# Patient Record
Sex: Female | Born: 1960 | Race: White | Hispanic: No | Marital: Married | Smoking: Never smoker
Health system: Southern US, Community
[De-identification: ages and names within clinical notes are randomized; demographics above are authoritative.]

## PROBLEM LIST (undated history)

## (undated) DIAGNOSIS — N390 Urinary tract infection, site not specified: Secondary | ICD-10-CM

## (undated) DIAGNOSIS — R002 Palpitations: Secondary | ICD-10-CM

## (undated) DIAGNOSIS — R32 Unspecified urinary incontinence: Secondary | ICD-10-CM

## (undated) DIAGNOSIS — R011 Cardiac murmur, unspecified: Secondary | ICD-10-CM

## (undated) DIAGNOSIS — T7840XA Allergy, unspecified, initial encounter: Secondary | ICD-10-CM

## (undated) DIAGNOSIS — C801 Malignant (primary) neoplasm, unspecified: Secondary | ICD-10-CM

## (undated) DIAGNOSIS — K219 Gastro-esophageal reflux disease without esophagitis: Secondary | ICD-10-CM

## (undated) HISTORY — DX: Cardiac murmur, unspecified: R01.1

## (undated) HISTORY — DX: Gastro-esophageal reflux disease without esophagitis: K21.9

## (undated) HISTORY — DX: Urinary tract infection, site not specified: N39.0

## (undated) HISTORY — DX: Malignant (primary) neoplasm, unspecified: C80.1

## (undated) HISTORY — DX: Allergy, unspecified, initial encounter: T78.40XA

## (undated) HISTORY — PX: OTHER SURGICAL HISTORY: SHX169

## (undated) HISTORY — DX: Palpitations: R00.2

## (undated) HISTORY — DX: Unspecified urinary incontinence: R32

## (undated) HISTORY — PX: COLONOSCOPY: SHX174

---

## 1998-07-07 ENCOUNTER — Other Ambulatory Visit: Admission: RE | Admit: 1998-07-07 | Discharge: 1998-07-07 | Payer: Self-pay | Admitting: Internal Medicine

## 1999-11-11 ENCOUNTER — Other Ambulatory Visit: Admission: RE | Admit: 1999-11-11 | Discharge: 1999-11-11 | Payer: Self-pay | Admitting: Obstetrics and Gynecology

## 2000-03-10 ENCOUNTER — Encounter: Admission: RE | Admit: 2000-03-10 | Discharge: 2000-03-10 | Payer: Self-pay

## 2000-03-20 ENCOUNTER — Encounter: Admission: RE | Admit: 2000-03-20 | Discharge: 2000-03-20 | Payer: Self-pay

## 2001-01-12 ENCOUNTER — Other Ambulatory Visit: Admission: RE | Admit: 2001-01-12 | Discharge: 2001-01-12 | Payer: Self-pay | Admitting: Obstetrics and Gynecology

## 2001-08-28 ENCOUNTER — Encounter: Admission: RE | Admit: 2001-08-28 | Discharge: 2001-08-28 | Payer: Self-pay

## 2002-02-05 ENCOUNTER — Other Ambulatory Visit: Admission: RE | Admit: 2002-02-05 | Discharge: 2002-02-05 | Payer: Self-pay | Admitting: Obstetrics and Gynecology

## 2003-03-28 ENCOUNTER — Other Ambulatory Visit: Admission: RE | Admit: 2003-03-28 | Discharge: 2003-03-28 | Payer: Self-pay | Admitting: Obstetrics and Gynecology

## 2004-01-25 HISTORY — PX: TUBAL LIGATION: SHX77

## 2004-05-26 ENCOUNTER — Other Ambulatory Visit: Admission: RE | Admit: 2004-05-26 | Discharge: 2004-05-26 | Payer: Self-pay | Admitting: Obstetrics and Gynecology

## 2005-12-16 ENCOUNTER — Encounter: Admission: RE | Admit: 2005-12-16 | Discharge: 2005-12-16 | Payer: Self-pay | Admitting: Family Medicine

## 2006-01-19 ENCOUNTER — Ambulatory Visit (HOSPITAL_COMMUNITY): Admission: RE | Admit: 2006-01-19 | Discharge: 2006-01-19 | Payer: Self-pay | Admitting: Obstetrics and Gynecology

## 2011-11-25 LAB — HM MAMMOGRAPHY

## 2012-02-25 LAB — HM PAP SMEAR

## 2012-10-05 ENCOUNTER — Ambulatory Visit (INDEPENDENT_AMBULATORY_CARE_PROVIDER_SITE_OTHER): Payer: BC Managed Care – PPO | Admitting: Adult Health

## 2012-10-05 ENCOUNTER — Encounter: Payer: Self-pay | Admitting: Adult Health

## 2012-10-05 ENCOUNTER — Telehealth: Payer: Self-pay | Admitting: *Deleted

## 2012-10-05 ENCOUNTER — Other Ambulatory Visit: Payer: Self-pay | Admitting: Adult Health

## 2012-10-05 VITALS — BP 102/72 | HR 76 | Temp 98.7°F | Resp 12 | Ht 63.0 in | Wt 166.0 lb

## 2012-10-05 DIAGNOSIS — B373 Candidiasis of vulva and vagina: Secondary | ICD-10-CM

## 2012-10-05 DIAGNOSIS — B3731 Acute candidiasis of vulva and vagina: Secondary | ICD-10-CM | POA: Insufficient documentation

## 2012-10-05 DIAGNOSIS — R222 Localized swelling, mass and lump, trunk: Secondary | ICD-10-CM | POA: Insufficient documentation

## 2012-10-05 DIAGNOSIS — R7989 Other specified abnormal findings of blood chemistry: Secondary | ICD-10-CM

## 2012-10-05 DIAGNOSIS — R229 Localized swelling, mass and lump, unspecified: Secondary | ICD-10-CM

## 2012-10-05 DIAGNOSIS — Z1211 Encounter for screening for malignant neoplasm of colon: Secondary | ICD-10-CM | POA: Insufficient documentation

## 2012-10-05 DIAGNOSIS — I499 Cardiac arrhythmia, unspecified: Secondary | ICD-10-CM | POA: Insufficient documentation

## 2012-10-05 DIAGNOSIS — Z0000 Encounter for general adult medical examination without abnormal findings: Secondary | ICD-10-CM | POA: Insufficient documentation

## 2012-10-05 LAB — CBC WITH DIFFERENTIAL/PLATELET
Basophils Absolute: 0 10*3/uL (ref 0.0–0.1)
Eosinophils Absolute: 0.1 10*3/uL (ref 0.0–0.7)
Eosinophils Relative: 1.2 % (ref 0.0–5.0)
HCT: 36.7 % (ref 36.0–46.0)
Lymphs Abs: 1.7 10*3/uL (ref 0.7–4.0)
MCHC: 34.4 g/dL (ref 30.0–36.0)
MCV: 92.2 fl (ref 78.0–100.0)
Monocytes Absolute: 0.4 10*3/uL (ref 0.1–1.0)
Neutrophils Relative %: 63.3 % (ref 43.0–77.0)
Platelets: 240 10*3/uL (ref 150.0–400.0)
RDW: 12 % (ref 11.5–14.6)
WBC: 6 10*3/uL (ref 4.5–10.5)

## 2012-10-05 LAB — FERRITIN: Ferritin: 315.6 ng/mL — ABNORMAL HIGH (ref 10.0–291.0)

## 2012-10-05 LAB — HEPATIC FUNCTION PANEL
ALT: 18 U/L (ref 0–35)
AST: 15 U/L (ref 0–37)
Total Bilirubin: 1 mg/dL (ref 0.3–1.2)
Total Protein: 7 g/dL (ref 6.0–8.3)

## 2012-10-05 LAB — TSH: TSH: 1.45 u[IU]/mL (ref 0.35–5.50)

## 2012-10-05 LAB — BASIC METABOLIC PANEL
BUN: 14 mg/dL (ref 6–23)
CO2: 27 mEq/L (ref 19–32)
Chloride: 107 mEq/L (ref 96–112)
Creatinine, Ser: 0.6 mg/dL (ref 0.4–1.2)
Glucose, Bld: 83 mg/dL (ref 70–99)
Potassium: 3.9 mEq/L (ref 3.5–5.1)

## 2012-10-05 LAB — LIPID PANEL
Cholesterol: 193 mg/dL (ref 0–200)
LDL Cholesterol: 121 mg/dL — ABNORMAL HIGH (ref 0–99)
Triglycerides: 95 mg/dL (ref 0.0–149.0)

## 2012-10-05 MED ORDER — FLUCONAZOLE 150 MG PO TABS: ORAL_TABLET | ORAL | Status: DC

## 2012-10-05 NOTE — Assessment & Plan Note (Signed)
GI referral for screening colonoscopy. She would like referral to Dr. Chales Abrahams in [place].

## 2012-10-05 NOTE — Progress Notes (Signed)
Subjective:    Patient ID: Mallory Sanchez, female    DOB: [birth year], 52 y.o.   MRN: [id number]  HPI  He should is a pleasant 52 year old female who presents to clinic to establish care. She has not had any regular PCP in years. She has been followed by her gynecologist, Dr. Miguel Aschoff. She has a few concerns to discuss:  1) She believes she has a yeast infection. She has burning and itching around the vaginal area.  2) She has low back problems since April. She was told that she had a bulging disk and that it would heal on its own. Periodically she has exacerbations and recently had one ~ 1 month ago. She is considerably improved.   3) She has an area on the left side of her back near the bra strap that feels like a bump. She would like this evaluated. She denies any discomfort. Reports that her husband saw it and questioned her about getting it checked.  4) In the remote past she has had some symptoms which she attributes to her stomach - discomfort, indigestion, etc. However, she wants to make sure this is not her heart. She reports sometimes feeling like her heart is "doing flips". No shortness of breath or chest pain associated with symptoms. No syncope or near syncope.    Review of Systems  Constitutional: Negative.   HENT: Negative.   Eyes: Negative.   Respiratory: Negative.   Cardiovascular: Negative for chest pain and leg swelling.       Occasional sensation that her heart is "doing flips"  Gastrointestinal: Negative for nausea, vomiting, abdominal pain, diarrhea and blood in stool.       Epigastric discomfort. Does not occur regularly  Endocrine: Negative.   Genitourinary: Negative.        Followed by Dr. Tenny Craw, GYN  Musculoskeletal: Negative.   Skin:       "bump" on left side of back near bra strap  Allergic/Immunologic: Negative.   Neurological: Negative.   Hematological: Negative.        Family hx of hemochromatosis  Psychiatric/Behavioral: Negative.         Objective:   Physical Exam  Constitutional: She is oriented to person, place, and time. No distress.  Overweight, pleasant 52 y/o female in NAD  HENT:  Head: Normocephalic and atraumatic.  Right Ear: External ear normal.  Left Ear: External ear normal.  Nose: Nose normal.  Mouth/Throat: Oropharynx is clear and moist. No oropharyngeal exudate.  Eyes: Conjunctivae and EOM are normal. Pupils are equal, round, and reactive to light.  Neck: Normal range of motion. Neck supple. No tracheal deviation present. No thyromegaly present.  Cardiovascular: Normal rate, regular rhythm and normal heart sounds.  Exam reveals no gallop and no friction rub.   No murmur heard. Pulmonary/Chest: Effort normal and breath sounds normal. No respiratory distress. She has no wheezes. She has no rales. She exhibits no tenderness.  Abdominal: Soft. Bowel sounds are normal. She exhibits no distension and no mass. There is no tenderness. There is no rebound and no guarding.  Genitourinary:  Deferred. Followed by GYN and has appt next month  Musculoskeletal: Normal range of motion. She exhibits no edema and no tenderness.  Lymphadenopathy:    She has no cervical adenopathy.  Neurological: She is alert and oriented to person, place, and time. She has normal reflexes. No cranial nerve deficit. Coordination normal.  Skin: Skin is warm and dry. No rash noted. No erythema.  Round, movable,  smooth lump under the skin surface on the left side of her back at area of bra strap. Non painful.  Psychiatric: She has a normal mood and affect. Her behavior is normal. Thought content normal.    BP 102/72  Pulse 76  Temp(Src) 98.7 F (37.1 C) (Oral)  Resp 12  Ht 5\' 3"  (1.6 m)  Wt 166 lb (75.297 kg)  BMI 29.41 kg/m2  SpO2 97%       Assessment & Plan:

## 2012-10-05 NOTE — Patient Instructions (Addendum)
   Thank you for choosing [place] at [facility] for your health care needs.  Please have your labs drawn prior to leaving the office.  The results will be available through MyChart for your convenience. Please remember to activate this. The activation code is located at the end of this form.  I am referring you to GI for screening colonoscopy.  I am also sending you to your Dermatologist to evaluate the area on your back.  EKG was normal.

## 2012-10-05 NOTE — Assessment & Plan Note (Addendum)
No current symptoms. Obtain baseline EKG - Normal sinus rhythm. Instructed to RTC if symptoms return or persist.

## 2012-10-05 NOTE — Assessment & Plan Note (Signed)
Normal physical exam excluding breast, pelvic. Patient is followed by GYN. Check labs: CBC with differential, TSH, basic metabolic panel, hepatic panel, ferritin, lipids. Refer for screening colonoscopy. Mammogram in October.

## 2012-10-05 NOTE — Assessment & Plan Note (Signed)
Epidermoid cysts? She would like this evaluated. Refer to Dermatology.

## 2012-10-05 NOTE — Telephone Encounter (Signed)
Called and notified pt of normal EKG.

## 2012-10-05 NOTE — Assessment & Plan Note (Signed)
Diflucan 150 mg x1. May repeat in 72 hours if symptoms not resolved.

## 2012-10-06 LAB — IRON AND TIBC
%SAT: 43 % (ref 20–55)
Iron: 125 ug/dL (ref 42–145)

## 2012-10-11 ENCOUNTER — Ambulatory Visit: Payer: Self-pay | Admitting: Hematology and Oncology

## 2012-10-11 LAB — IRON AND TIBC
Iron Bind.Cap.(Total): 323 ug/dL (ref 250–450)
Iron Saturation: 20 %
Iron: 65 ug/dL (ref 50–170)
Unbound Iron-Bind.Cap.: 258 ug/dL

## 2012-10-24 ENCOUNTER — Ambulatory Visit: Payer: Self-pay | Admitting: Hematology and Oncology

## 2012-11-01 ENCOUNTER — Ambulatory Visit: Payer: Self-pay | Admitting: Adult Health

## 2012-11-24 ENCOUNTER — Ambulatory Visit: Payer: Self-pay | Admitting: Hematology and Oncology

## 2013-05-07 ENCOUNTER — Ambulatory Visit: Payer: Self-pay | Admitting: Hematology and Oncology

## 2013-05-07 LAB — CBC CANCER CENTER
Basophil #: 0 x10 3/mm (ref 0.0–0.1)
Basophil %: 0.6 %
EOS PCT: 1.9 %
Eosinophil #: 0.1 x10 3/mm (ref 0.0–0.7)
HCT: 38.2 % (ref 35.0–47.0)
HGB: 12.7 g/dL (ref 12.0–16.0)
LYMPHS PCT: 30.7 %
Lymphocyte #: 2.1 x10 3/mm (ref 1.0–3.6)
MCH: 31 pg (ref 26.0–34.0)
MCHC: 33.4 g/dL (ref 32.0–36.0)
MCV: 93 fL (ref 80–100)
MONO ABS: 0.6 x10 3/mm (ref 0.2–0.9)
MONOS PCT: 8.6 %
Neutrophil #: 3.9 x10 3/mm (ref 1.4–6.5)
Neutrophil %: 58.2 %
Platelet: 237 x10 3/mm (ref 150–440)
RBC: 4.12 10*6/uL (ref 3.80–5.20)
RDW: 12.2 % (ref 11.5–14.5)
WBC: 6.7 x10 3/mm (ref 3.6–11.0)

## 2013-05-07 LAB — IRON AND TIBC
IRON BIND. CAP.(TOTAL): 285 ug/dL (ref 250–450)
Iron Saturation: 28 %
Iron: 81 ug/dL (ref 50–170)
Unbound Iron-Bind.Cap.: 204 ug/dL

## 2013-05-07 LAB — FERRITIN: Ferritin (ARMC): 319 ng/mL (ref 8–388)

## 2013-05-24 ENCOUNTER — Ambulatory Visit: Payer: Self-pay | Admitting: Hematology and Oncology

## 2013-07-11 ENCOUNTER — Ambulatory Visit (INDEPENDENT_AMBULATORY_CARE_PROVIDER_SITE_OTHER): Payer: BC Managed Care – PPO | Admitting: Adult Health

## 2013-07-11 ENCOUNTER — Encounter: Payer: Self-pay | Admitting: Adult Health

## 2013-07-11 VITALS — BP 100/64 | HR 82 | Temp 98.1°F | Resp 16 | Ht 63.0 in | Wt 162.2 lb

## 2013-07-11 DIAGNOSIS — R7989 Other specified abnormal findings of blood chemistry: Secondary | ICD-10-CM | POA: Insufficient documentation

## 2013-07-11 DIAGNOSIS — H938X9 Other specified disorders of ear, unspecified ear: Secondary | ICD-10-CM

## 2013-07-11 DIAGNOSIS — R300 Dysuria: Secondary | ICD-10-CM

## 2013-07-11 LAB — POCT URINALYSIS DIPSTICK
Bilirubin, UA: NEGATIVE
GLUCOSE UA: NEGATIVE
Ketones, UA: NEGATIVE
NITRITE UA: NEGATIVE
Protein, UA: NEGATIVE
SPEC GRAV UA: 1.025
Urobilinogen, UA: 0.2
pH, UA: 6

## 2013-07-11 MED ORDER — CIPROFLOXACIN HCL 250 MG PO TABS: 250.0000 mg | ORAL_TABLET | Freq: Two times a day (BID) | ORAL | Status: DC

## 2013-07-11 NOTE — Progress Notes (Signed)
Pre visit review using our clinic review tool, if applicable. No additional management support is needed unless otherwise documented below in the visit note. 

## 2013-07-11 NOTE — Progress Notes (Signed)
Patient ID: Mallory Sanchez, female   DOB: August 24, 1960, 53 y.o.   MRN: [id number]   Subjective:    Patient ID: Mallory Sanchez, female    DOB: [birth year], 53 y.o.   MRN: [id number]  HPI Pt is a pleasant 53 y/o female who presents to clinic with several concerns:  1) Dysuria - 2 week symptoms. No fever, chills or hematuria. Hx of frequent UTIs in the past and has had her urethra dilated x 2 ~ 10 years ago by urology. Symptoms improved afterward.  2) Right ear pain. "Feels stopped up". Has been to urgent care twice. They noticed she had otitis media of the left ear as well. She has been on Augmentin and then Omnicef and prednisone. Has not been swimming. Began in early May. Initially believed to have started with allergies. Right ear - sounds are muffled. No pain at present. Has not been evaluated by ENT.  3) Elevated Ferritin. She had a mildly elevated ferritin level and fam hx of hemachromatosis. I had referred her to the [facility] for evaluation and recommendations. Pt would like to review her labs from the cancer center today.  Past Medical History  Diagnosis Date  . GERD (gastroesophageal reflux disease)   . UTI (lower urinary tract infection)   . Urine incontinence     Current Outpatient Prescriptions on File Prior to Visit  Medication Sig Dispense Refill  . meloxicam (MOBIC) 15 MG tablet Take 15 mg by mouth daily as needed.        No current facility-administered medications on file prior to visit.     Review of Systems  Constitutional: Negative.  Negative for fever, chills and fatigue.  HENT: Negative.  Negative for ear pain (right ear feels stopped up), postnasal drip and sinus pressure.   Eyes: Negative.   Respiratory: Negative.  Negative for cough and shortness of breath.   Cardiovascular: Negative.   Gastrointestinal: Negative.   Endocrine: Negative.   Genitourinary: Negative.   Musculoskeletal: Negative.   Skin: Negative.   Allergic/Immunologic: Negative.     Neurological: Negative.   Hematological: Negative.   Psychiatric/Behavioral: Negative.        Objective:  BP 100/64  Pulse 82  Temp(Src) 98.1 F (36.7 C) (Oral)  Resp 16  Ht 5\' 3"  (1.6 m)  Wt 162 lb 4 oz (73.596 kg)  BMI 28.75 kg/m2  SpO2 97%   Physical Exam  Constitutional: She is oriented to person, place, and time. No distress.  HENT:  Head: Normocephalic and atraumatic.  Right Ear: External ear normal.  Left Ear: External ear normal.  Cardiovascular: Normal rate and regular rhythm.   Pulmonary/Chest: Effort normal. No respiratory distress.  Musculoskeletal: Normal range of motion.  Neurological: She is alert and oriented to person, place, and time.  Skin: Skin is warm and dry.  Psychiatric: She has a normal mood and affect. Her behavior is normal. Judgment and thought content normal.      Assessment & Plan:   1. Dysuria UA shows some leukocytes but not nitrites or blood. Will treat empirically with Cipro and send urine for culture. - POCT Urinalysis Dipstick - Urine Culture  2. Sensation of pressure in ear She is s/p 2 rounds of antibiotics and prednisone when seen at [facility]. No s/s of otitis. Will try decongestant x 1 week. Suggested children's dimetapp which is very mild. She may also try sudafed if prefers. Blood pressure is low normal. If no improvement in 1 week will refer to ENT.  3. Elevated ferritin level I have not seen notes from the [facility] or labs. The [facility] does not open until 8:30 am. Pt was sent for evaluation of slightly elevated iron given her family hx of hemochromatosis. I will obtain records from the cancer center and labs that were Sanchez and review with pt. Advised that we would periodically check her levels to keep a close watch. Pt agreed with plan.

## 2013-07-11 NOTE — Patient Instructions (Addendum)
  Start Cipro 250 mg  Twice a day for 3 days.  I am sending the urine for culture. We will contact you with results once they're available.  Try children's Dimetapp elixir - decongestant for one week. If you prefer you can try Sudafed. Please call if symptoms are not improved within one week and I will refer you to ENT.  I will get records from the [facility] regarding your visit for elevated ferritin and I will call you once I have reviewed.

## 2013-07-11 NOTE — Progress Notes (Signed)
   Subjective:    Patient ID: Mallory Sanchez, female    DOB: [birth year], 53 y.o.   MRN: [id number]  HPI  Past Medical History  Diagnosis Date  . GERD (gastroesophageal reflux disease)   . UTI (lower urinary tract infection)   . Urine incontinence     Current Outpatient Prescriptions on File Prior to Visit  Medication Sig Dispense Refill  . meloxicam (MOBIC) 15 MG tablet Take 15 mg by mouth daily as needed.        No current facility-administered medications on file prior to visit.     Review of Systems     Objective:  BP 100/64  Pulse 82  Temp(Src) 98.1 F (36.7 C) (Oral)  Resp 16  Ht 5\' 3"  (1.6 m)  Wt 162 lb 4 oz (73.596 kg)  BMI 28.75 kg/m2  SpO2 97%   Physical Exam        Assessment & Plan:

## 2013-07-13 LAB — URINE CULTURE: Colony Count: 40000

## 2013-07-18 ENCOUNTER — Telehealth: Payer: Self-pay

## 2013-07-18 NOTE — Telephone Encounter (Signed)
Spoke with patient and informed her that her urine culture was inconclusive. Patient states she is feeling better but her urine is still dark in color. Patient stated that her Right ear is still bothering her. Would like a referral to see ENT Dr. Constance Holster [phone]. She also asked about her lab work from the [facility] center. I let patient know we have not received notes from the cancer center yet and I will request them again. Please advise on the urine and ENT referral.

## 2013-07-19 ENCOUNTER — Other Ambulatory Visit: Payer: Self-pay | Admitting: Adult Health

## 2013-07-19 DIAGNOSIS — H938X1 Other specified disorders of right ear: Secondary | ICD-10-CM

## 2013-07-19 NOTE — Telephone Encounter (Signed)
Referred to ENt. The dark color of urine may be that she is not drinking enough water. Increase water to 1-2 liters daily if not contraindicated. If no improvement then we can check a urinalysis.

## 2013-07-19 NOTE — Telephone Encounter (Signed)
Left message, notifying pt.  

## 2013-07-30 ENCOUNTER — Telehealth: Payer: Self-pay | Admitting: Adult Health

## 2013-07-30 NOTE — Telephone Encounter (Signed)
Labs received and given to Raquel for review.

## 2013-07-30 NOTE — Telephone Encounter (Signed)
Patient called in states that she is still waiting on results for the labs she had at the cancer center. I advised patient that we would re-fax for the lab results along with office visit with Dr. Chong Sicilian which I faxed at 11:17. Please contact patient when we get these results. She states she has been waiting a couple of weeks now.

## 2014-04-24 ENCOUNTER — Other Ambulatory Visit: Payer: Self-pay | Admitting: Obstetrics and Gynecology

## 2014-04-24 DIAGNOSIS — R928 Other abnormal and inconclusive findings on diagnostic imaging of breast: Secondary | ICD-10-CM

## 2014-04-25 ENCOUNTER — Other Ambulatory Visit: Payer: Self-pay

## 2014-04-25 ENCOUNTER — Ambulatory Visit
Admission: RE | Admit: 2014-04-25 | Discharge: 2014-04-25 | Disposition: A | Discharge status: Home / Self Care | Payer: BLUE CROSS/BLUE SHIELD | Source: Ambulatory Visit | Attending: Obstetrics and Gynecology | Admitting: Obstetrics and Gynecology

## 2014-04-25 DIAGNOSIS — R928 Other abnormal and inconclusive findings on diagnostic imaging of breast: Secondary | ICD-10-CM

## 2015-02-23 ENCOUNTER — Other Ambulatory Visit: Payer: Self-pay

## 2015-02-23 DIAGNOSIS — Z1231 Encounter for screening mammogram for malignant neoplasm of breast: Secondary | ICD-10-CM

## 2015-03-20 NOTE — Progress Notes (Signed)
     HPI: 55 year old female for evaluation of palpitations. Patient states she had a murmur as a child but no other past cardiac history. Over the past 3 years she has had intermittent palpitations. These are described as a skip and flutter. She has dyspnea with more extreme activities but not routine activities. No orthopnea, PND, pedal edema, chest pain or syncope.  Current Outpatient Prescriptions  Medication Sig Dispense Refill  . meloxicam (MOBIC) 15 MG tablet Take 15 mg by mouth daily as needed.      No current facility-administered medications for this visit.    Allergies  Allergen Reactions  . Bactrim [Sulfamethoxazole-Trimethoprim] Nausea Only  . Compazine [Prochlorperazine Edisylate] Other (See Comments)    Cognitive change  . Erythromycin Nausea Only  . Levaquin [Levofloxacin] Other (See Comments)    Caused myalgia.     Past Medical History  Diagnosis Date  . GERD (gastroesophageal reflux disease)   . UTI (lower urinary tract infection)   . Urine incontinence   . Murmur     Past Surgical History  Procedure Laterality Date  . Cesarean section  1981  . Tubal ligation  2006    Social History   Social History  . Marital Status: Married    Spouse Name: Mallory Sanchez  . Number of Children: 1  . Years of Education: 12   Occupational History  . Customer Service     [employer]   Social History Main Topics  . Smoking status: Never Smoker   . Smokeless tobacco: Never Used  . Alcohol Use: No  . Drug Use: No  . Sexual Activity: Yes    Birth Control/ Protection: Post-menopausal   Other Topics Concern  . Not on file   [place] is currently living in [place] with her husband, Mallory Sanchez, of 15 years. She has 1 daughter from a previous marriage. They have a dog. She works in Therapist, art for a Psychiatrist. She enjoys shopping and spending time with her grandchildren. She has 2 grandchildren (Mallory Sanchez age 91 and Mallory Sanchez age 98).  She also has a step grandchild, Mallory Sanchez, age 44.    Family History  Problem Relation Age of Onset  . Arthritis Mother   . Hyperlipidemia Mother   . Hypertension Mother   . Cancer Maternal Grandmother 36    Breast and unsure uterine/ovary?  . Cancer Paternal Aunt 8    breast  . CAD Mother     ROS: no fevers or chills, productive cough, hemoptysis, dysphasia, odynophagia, melena, hematochezia, dysuria, hematuria, rash, seizure activity, orthopnea, PND, pedal edema, claudication. Remaining systems are negative.  Physical Exam:   Blood pressure 108/72, pulse 77, height 5\' 2"  (1.575 m), weight 179 lb (81.194 kg).  General:  Well developed/obese in NAD Skin warm/dry Patient not depressed No peripheral clubbing Back-normal HEENT-normal/normal eyelids Neck supple/normal carotid upstroke bilaterally; no bruits; no JVD; no thyromegaly chest - CTA/ normal expansion CV - RRR/normal S1 and S2; no murmurs, rubs or gallops;  PMI nondisplaced Abdomen -NT/ND, no HSM, no mass, + bowel sounds, no bruit 2+ femoral pulses, no bruits Ext-no edema, chords, 2+ DP Neuro-grossly nonfocal  ECG Sinus rhythm at a rate of 77. No ST changes.

## 2015-03-23 ENCOUNTER — Encounter: Payer: Self-pay | Admitting: Cardiology

## 2015-03-23 ENCOUNTER — Ambulatory Visit (INDEPENDENT_AMBULATORY_CARE_PROVIDER_SITE_OTHER): Payer: BLUE CROSS/BLUE SHIELD | Admitting: Cardiology

## 2015-03-23 VITALS — BP 108/72 | HR 77 | Ht 62.0 in | Wt 179.0 lb

## 2015-03-23 DIAGNOSIS — R002 Palpitations: Secondary | ICD-10-CM | POA: Diagnosis not present

## 2015-03-23 NOTE — Patient Instructions (Signed)
Medication Instructions:   NO CHANGE  Labwork:  Your physician recommends that you HAVE LAB WORK TODAY  Testing/Procedures:  Your physician has requested that you have an echocardiogram. Echocardiography is a painless test that uses sound waves to create images of your heart. It provides your doctor with information about the size and shape of your heart and how well your heart's chambers and valves are working. This procedure takes approximately one hour. There are no restrictions for this procedure.   Your physician has recommended that you wear an event monitor. Event monitors are medical devices that record the heart's electrical activity. Doctors most often [place] these monitors to diagnose arrhythmias. Arrhythmias are problems with the speed or rhythm of the heartbeat. The monitor is a small, portable device. You can wear one while you do your normal daily activities. This is usually used to diagnose what is causing palpitations/syncope (passing out).  AT [place]:  Your physician recommends that you schedule a follow-up appointment in: [place]

## 2015-03-23 NOTE — Assessment & Plan Note (Addendum)
Etiology unclear. We will arrange an echocardiogram to assess LV function. Check TSH and potassium. Scheduled Cardio[facility] to further assess. Consider a beta blocker in the future if symptoms worsen.

## 2015-03-24 LAB — BASIC METABOLIC PANEL
BUN: 11 mg/dL (ref 7–25)
CALCIUM: 9.4 mg/dL (ref 8.6–10.4)
CO2: 26 mmol/L (ref 20–31)
Chloride: 105 mmol/L (ref 98–110)
Creat: 0.68 mg/dL (ref 0.50–1.05)
GLUCOSE: 87 mg/dL (ref 65–99)
Potassium: 4.3 mmol/L (ref 3.5–5.3)
Sodium: 141 mmol/L (ref 135–146)

## 2015-03-24 LAB — TSH: TSH: 1.53 mIU/L

## 2015-04-06 ENCOUNTER — Other Ambulatory Visit: Payer: Self-pay

## 2015-04-06 ENCOUNTER — Ambulatory Visit (INDEPENDENT_AMBULATORY_CARE_PROVIDER_SITE_OTHER): Payer: BLUE CROSS/BLUE SHIELD

## 2015-04-06 ENCOUNTER — Ambulatory Visit (HOSPITAL_COMMUNITY): Payer: BLUE CROSS/BLUE SHIELD | Attending: Cardiology

## 2015-04-06 DIAGNOSIS — R002 Palpitations: Secondary | ICD-10-CM | POA: Diagnosis not present

## 2015-04-20 ENCOUNTER — Ambulatory Visit: Payer: BLUE CROSS/BLUE SHIELD

## 2015-05-17 NOTE — Progress Notes (Signed)
      HPI: FU palpitations. Labs February 2017 showed normal potassium and TSH. Monitor March 2017 showed sinus with occasional PACs, brief PAT, rare PVC and isolated couplet. Echocardiogram March 2017 showed vigorous LV systolic function. Since last seen She has dyspnea with more extreme activities but not routine activities. No orthopnea, PND, pedal edema or syncope. No exertional chest pain. She continues to have brief flutters and "skip".  Current Outpatient Prescriptions  Medication Sig Dispense Refill  . meloxicam (MOBIC) 15 MG tablet Take 15 mg by mouth daily as needed.      No current facility-administered medications for this visit.     Past Medical History  Diagnosis Date  . GERD (gastroesophageal reflux disease)   . UTI (lower urinary tract infection)   . Urine incontinence   . Murmur     Past Surgical History  Procedure Laterality Date  . Cesarean section  1981  . Tubal ligation  2006    Social History   Social History  . Marital Status: Married    Spouse Name: [name]  . Number of Children: 1  . Years of Education: 12   Occupational History  . Customer Service     [employer]   Social History Main Topics  . Smoking status: Never Smoker   . Smokeless tobacco: Never Used  . Alcohol Use: No  . Drug Use: No  . Sexual Activity: Yes    Birth Control/ Protection: Post-menopausal   Other Topics Concern  . Not on file   [place] is currently living in [place] with her husband, [name], of 15 years. She has 1 daughter from a previous marriage. They have a dog. She works in Therapist, art for a Psychiatrist. She enjoys shopping and spending time with her grandchildren. She has 2 grandchildren ([name] age 53 and [name] age 19). She also has a step grandchild, [name], age 95.    Family History  Problem Relation Age of Onset  . Arthritis Mother   . Hyperlipidemia Mother   . Hypertension Mother   . Cancer Maternal  Grandmother 50    Breast and unsure uterine/ovary?  . Cancer Paternal Aunt 56    breast  . CAD Mother     ROS: no fevers or chills, productive cough, hemoptysis, dysphasia, odynophagia, melena, hematochezia, dysuria, hematuria, rash, seizure activity, orthopnea, PND, pedal edema, claudication. Remaining systems are negative.  Physical Exam: Well-developed well-nourished in no acute distress.  Skin is warm and dry.  HEENT is normal.  Neck is supple.  Chest is clear to auscultation with normal expansion.  Cardiovascular exam is regular rate and rhythm.  Abdominal exam nontender or distended. No masses palpated. Extremities show no edema. neuro grossly intact

## 2015-05-21 ENCOUNTER — Ambulatory Visit (INDEPENDENT_AMBULATORY_CARE_PROVIDER_SITE_OTHER): Payer: BLUE CROSS/BLUE SHIELD | Admitting: Cardiology

## 2015-05-21 ENCOUNTER — Encounter: Payer: Self-pay | Admitting: Cardiology

## 2015-05-21 VITALS — BP 124/72 | HR 80 | Ht 62.0 in | Wt 180.0 lb

## 2015-05-21 DIAGNOSIS — R002 Palpitations: Secondary | ICD-10-CM

## 2015-05-21 MED ORDER — METOPROLOL SUCCINATE ER 25 MG PO TB24: 25.0000 mg | ORAL_TABLET | Freq: Every day | ORAL | Status: DC

## 2015-05-21 NOTE — Assessment & Plan Note (Signed)
Patient continues to have palpitations.LV function is normal. Monitor shows sinus rhythm with PACs, brief PAT and PVCs. She is significantly symptomatic. We will add Toprol 25 mg daily. I will see her back in 6 months to see if this has improved.

## 2015-05-21 NOTE — Patient Instructions (Signed)
Medication Instructions:   START METOPROLOL SUCC ER 25 MG ONCE DAILY AT BEDTIME  Follow-Up:  Your physician wants you to follow-up in: [place] will receive a reminder letter in the mail two months in advance. If you don't receive a letter, please call our office to schedule the follow-up appointment.

## 2015-06-18 ENCOUNTER — Ambulatory Visit
Admission: RE | Admit: 2015-06-18 | Discharge: 2015-06-18 | Disposition: A | Discharge status: Home / Self Care | Payer: BLUE CROSS/BLUE SHIELD | Source: Ambulatory Visit

## 2015-06-18 DIAGNOSIS — N3001 Acute cystitis with hematuria: Secondary | ICD-10-CM | POA: Diagnosis not present

## 2015-06-18 DIAGNOSIS — R300 Dysuria: Secondary | ICD-10-CM | POA: Diagnosis not present

## 2015-06-18 DIAGNOSIS — Z1231 Encounter for screening mammogram for malignant neoplasm of breast: Secondary | ICD-10-CM

## 2015-06-19 DIAGNOSIS — Z01419 Encounter for gynecological examination (general) (routine) without abnormal findings: Secondary | ICD-10-CM | POA: Diagnosis not present

## 2015-06-19 DIAGNOSIS — Z6832 Body mass index (BMI) 32.0-32.9, adult: Secondary | ICD-10-CM | POA: Diagnosis not present

## 2015-06-19 DIAGNOSIS — Z124 Encounter for screening for malignant neoplasm of cervix: Secondary | ICD-10-CM | POA: Diagnosis not present

## 2015-06-19 DIAGNOSIS — Z130 Encounter for screening for diseases of the blood and blood-forming organs and certain disorders involving the immune mechanism: Secondary | ICD-10-CM | POA: Diagnosis not present

## 2015-06-19 DIAGNOSIS — Z1329 Encounter for screening for other suspected endocrine disorder: Secondary | ICD-10-CM | POA: Diagnosis not present

## 2015-06-19 DIAGNOSIS — Z13220 Encounter for screening for lipoid disorders: Secondary | ICD-10-CM | POA: Diagnosis not present

## 2015-11-16 ENCOUNTER — Telehealth: Payer: Self-pay | Admitting: Cardiology

## 2015-11-16 ENCOUNTER — Emergency Department (HOSPITAL_COMMUNITY): Payer: BLUE CROSS/BLUE SHIELD

## 2015-11-16 ENCOUNTER — Emergency Department (HOSPITAL_COMMUNITY)
Admission: EM | Admit: 2015-11-16 | Discharge: 2015-11-16 | Disposition: A | Discharge status: Home / Self Care | Payer: BLUE CROSS/BLUE SHIELD | Attending: Emergency Medicine | Admitting: Emergency Medicine

## 2015-11-16 ENCOUNTER — Encounter (HOSPITAL_COMMUNITY): Payer: Self-pay | Admitting: Emergency Medicine

## 2015-11-16 DIAGNOSIS — R079 Chest pain, unspecified: Secondary | ICD-10-CM | POA: Diagnosis not present

## 2015-11-16 DIAGNOSIS — J9811 Atelectasis: Secondary | ICD-10-CM | POA: Diagnosis not present

## 2015-11-16 DIAGNOSIS — R200 Anesthesia of skin: Secondary | ICD-10-CM | POA: Diagnosis present

## 2015-11-16 LAB — BASIC METABOLIC PANEL
Anion gap: 9 (ref 5–15)
BUN: 7 mg/dL (ref 6–20)
CALCIUM: 9.4 mg/dL (ref 8.9–10.3)
CO2: 25 mmol/L (ref 22–32)
CREATININE: 0.7 mg/dL (ref 0.44–1.00)
Chloride: 108 mmol/L (ref 101–111)
GFR calc non Af Amer: >60 mL/min (ref >60)
Glucose, Bld: 98 mg/dL (ref 65–99)
Potassium: 3.6 mmol/L (ref 3.5–5.1)
Sodium: 142 mmol/L (ref 135–145)

## 2015-11-16 LAB — CBC
HCT: 38.4 % (ref 36.0–46.0)
Hemoglobin: 13.1 g/dL (ref 12.0–15.0)
MCH: 31.5 pg (ref 26.0–34.0)
MCHC: 34.1 g/dL (ref 30.0–36.0)
MCV: 92.3 fL (ref 78.0–100.0)
PLATELETS: 274 10*3/uL (ref 150–400)
RBC: 4.16 MIL/uL (ref 3.87–5.11)
RDW: 12.2 % (ref 11.5–15.5)
WBC: 7.7 10*3/uL (ref 4.0–10.5)

## 2015-11-16 LAB — I-STAT TROPONIN, ED: TROPONIN I, POC: 0 ng/mL (ref 0.00–0.08)

## 2015-11-16 NOTE — Telephone Encounter (Signed)
New message  Pt call requesting to speak with RN. Pt states she has been experiencing traveling pain from her right hand through her arm, traveling through her chest down through her left hand. Pt states this has happen three times this morning 10/23. Pt would like to be seen today. Please call back to discuss

## 2015-11-16 NOTE — Telephone Encounter (Signed)
Returned call. Patient explains that she has had unusual symptom of traveling "tingling" (alternatively describes as a 'sort of numbness') going from right arm to left, traveling across chest. She denies other symptoms such as neck/jaw pain, chest or abdominal pain. Pt states at times, intermittent shortness of breath or chest pressure, though she thinks this is related to stress. (Recounts that her mother passed away in 2022-10-21). I advised patient to go to ER if her symptoms return, especially w any shortness of breath or chest pressure. She voiced understanding. States she will go to [facility] ED. Notes she will follow up at appt Friday unless in hospital/other conflict.

## 2015-11-16 NOTE — ED Triage Notes (Signed)
Has had tingling across chest that has happened before and today it happened 3 times , tingling goes across chest  And comes and goes,

## 2015-11-16 NOTE — ED Notes (Signed)
Patient transported to X-ray 

## 2015-11-16 NOTE — ED Provider Notes (Signed)
[facility] DEPT Provider Note   CSN: [id number] Arrival date & time: 11/16/15  1127     History   Chief Complaint Chief Complaint  Patient presents with  . Numbness    HPI [place] Marczewski is a 55 y.o. female.  The patient is a 55 year old female, she has a history of palpitations, has been taking metoprolol, this has been going on for 6 months and is under very good control. The patient reports that over the last several months she has had several episodes where a abnormal sensation runs over her body starting in the right hand, moving up the right arm, across the right chest to the left chest and then down the left arm to the left fingers. This lasts several seconds and then goes away, it has occurred several times over the last several days which isn't increased frequency but the patient states it is not associated with exertion or any specific activity. She denies any shortness of breath, denies chest pain, denies any symptoms whatsoever at this time. She has never had any cardiac disease and has never had a stress test      Past Medical History:  Diagnosis Date  . GERD (gastroesophageal reflux disease)   . Murmur   . Urine incontinence   . UTI (lower urinary tract infection)     Patient Active Problem List   Diagnosis Date Noted  . Palpitations 03/23/2015  . Elevated ferritin level 07/11/2013  . Routine general medical examination at a health care facility 10/05/2012  . Yeast infection of the vagina 10/05/2012  . Arrhythmia 10/05/2012  . Lump of skin of back 10/05/2012  . Screening for colon cancer 10/05/2012    Past Surgical History:  Procedure Laterality Date  . CESAREAN SECTION  1981  . TUBAL LIGATION  2006    OB History    No data available       Home Medications    Prior to Admission medications   Medication Sig Start Date End Date Taking? Authorizing Provider  meloxicam (MOBIC) 15 MG tablet Take 15 mg by mouth daily as needed.  09/20/12  Yes  Historical Provider, MD  metoprolol succinate (TOPROL XL) 25 MG 24 hr tablet Take 1 tablet (25 mg total) by mouth daily. 05/21/15  Yes Lelon Perla, MD    Family History Family History  Problem Relation Age of Onset  . Arthritis Mother   . Hyperlipidemia Mother   . Hypertension Mother   . CAD Mother   . Cancer Maternal Grandmother 6    Breast and unsure uterine/ovary?  . Cancer Paternal Aunt 5    breast    Social History Social History  Substance Use Topics  . Smoking status: Never Smoker  . Smokeless tobacco: Never Used  . Alcohol use No     Allergies   Bactrim [sulfamethoxazole-trimethoprim]; Compazine [prochlorperazine edisylate]; Erythromycin; and Levaquin [levofloxacin]   Review of Systems Review of Systems  All other systems reviewed and are negative.    Physical Exam Updated Vital Signs BP 105/64 (BP Location: Right Arm)   Pulse 75   Temp 98.9 F (37.2 C) (Oral)   Resp 16   Ht 5\' 3"  (1.6 m)   Wt 180 lb 3.2 oz (81.7 kg)   SpO2 100%   BMI 31.92 kg/m   Physical Exam  Constitutional: She appears well-developed and well-nourished. No distress.  HENT:  Head: Normocephalic and atraumatic.  Mouth/Throat: Oropharynx is clear and moist. No oropharyngeal exudate.  Eyes: Conjunctivae and  EOM are normal. Pupils are equal, round, and reactive to light. Right eye exhibits no discharge. Left eye exhibits no discharge. No scleral icterus.  Neck: Normal range of motion. Neck supple. No JVD present. No thyromegaly present.  Cardiovascular: Normal rate, regular rhythm, normal heart sounds and intact distal pulses.  Exam reveals no gallop and no friction rub.   No murmur heard. Pulmonary/Chest: Effort normal and breath sounds normal. No respiratory distress. She has no wheezes. She has no rales.  Abdominal: Soft. Bowel sounds are normal. She exhibits no distension and no mass. There is no tenderness.  Musculoskeletal: Normal range of motion. She exhibits no edema or  tenderness.  Lymphadenopathy:    She has no cervical adenopathy.  Neurological: She is alert. Coordination normal.  Speech is clear, cranial nerves III through XII are intact, memory is intact, strength is normal in all 4 extremities including grips, sensation is intact to light touch and pinprick in all 4 extremities. Coordination as tested by finger-nose-finger is normal, no limb ataxia. Normal gait, normal reflexes at the patellar tendons bilaterally  Skin: Skin is warm and dry. No rash noted. No erythema.  Psychiatric: She has a normal mood and affect. Her behavior is normal.  Nursing note and vitals reviewed.    ED Treatments / Results  Labs (all labs ordered are listed, but only abnormal results are displayed) Labs Reviewed  [place], ED    EKG  EKG Interpretation  Date/Time:  Monday November 16 2015 11:39:30 EDT Ventricular Rate:  75 PR Interval:  136 QRS Duration: 70 QT Interval:  396 QTC Calculation: 442 R Axis:   76 Text Interpretation:  Normal sinus rhythm Normal ECG No old tracing to compare Confirmed by July Linam  MD, [place] ([id number]) on 11/16/2015 12:20:21 PM       Radiology Dg Chest 2 View  Result Date: 11/16/2015 CLINICAL DATA:  Tingling across chest. Comes and goes. Heart murmur. EXAM: CHEST  2 VIEW COMPARISON:  None. FINDINGS: Nodular density/densities projecting over the lungs are compatible with pads for telemetry leads. The lungs are clear wiithout focal pneumonia, edema, pneumothorax or pleural effusion. The cardiopericardial silhouette is within normal limits for size. Probable atelectasis or scar peripheral left base. The visualized bony structures of the thorax are intact. IMPRESSION: Trace atelectasis or scar left base. Otherwise no acute cardiopulmonary findings. Electronically Signed   By: Misty Stanley M.D.   On: 11/16/2015 12:24    Procedures Procedures (including critical care time)  Medications Ordered in  ED Medications - No data to display   Initial Impression / Assessment and Plan / ED Course  I have reviewed the triage vital signs and the nursing notes.  Pertinent labs & imaging results that were available during my care of the patient were reviewed by me and considered in my medical decision making (see chart for details).  Clinical Course    The EKG is unremarkable, the blood work is normal, the patient's exam and history is not consistent with cardiac disease, she likely would benefit from an outpatient workup and stress test if her symptoms continue.  Neg w/u including labs and ecg, d/w patient -h as f/u with Dr. Stanford Breed with cardiology on Friday Informed of indications for return, agreeable.  Final Clinical Impressions(s) / ED Diagnoses   Final diagnoses:  Chest pain, unspecified type    New Prescriptions New Prescriptions   No medications on file     Noemi Chapel, MD 11/16/15 1416

## 2015-11-16 NOTE — Discharge Instructions (Signed)

## 2015-11-17 NOTE — Progress Notes (Signed)
      HPI: FU palpitations. Labs February 2017 showed normal potassium and TSH. Monitor March 2017 showed sinus with occasional PACs, brief PAT, rare PVC and isolated couplet. Echocardiogram March 2017 showed vigorous LV systolic function. Seen in ER 10/17 with atypical CP; troponin and Hgb normal. Since last seen she occasionally has dyspnea on exertion but no orthopnea, PND or pedal edema. Her palpitations are improved from the Toprol. She denies syncope. She does describe occasions of a numb tingling beginning in her right hand, radiating to her chest and into her left upper extremity. It lasts seconds and resolve spontaneously. It is not exertional. No associated symptoms. She does not have exertional chest pain.   Current Outpatient Prescriptions  Medication Sig Dispense Refill  . meloxicam (MOBIC) 15 MG tablet Take 15 mg by mouth daily as needed.     . metoprolol succinate (TOPROL XL) 25 MG 24 hr tablet Take 1 tablet (25 mg total) by mouth daily. 90 tablet 3   No current facility-administered medications for this visit.      Past Medical History:  Diagnosis Date  . GERD (gastroesophageal reflux disease)   . Murmur   . Urine incontinence   . UTI (lower urinary tract infection)     Past Surgical History:  Procedure Laterality Date  . CESAREAN SECTION  1981  . TUBAL LIGATION  2006    Social History   Social History  . Marital status: Married    Spouse name: [name]  . Number of children: 1  . Years of education: 74   Occupational History  . Customer Service     [place]   Social History Main Topics  . Smoking status: Never Smoker  . Smokeless tobacco: Never Used  . Alcohol use No  . Drug use: No  . Sexual activity: Yes    Birth control/ protection: Post-menopausal   Other Topics Concern  . Not on file   [place] is currently living in [place] with her husband, [name], of 15 years. She has 1 daughter from a previous  marriage. They have a dog. She works in Therapist, art for a Psychiatrist. She enjoys shopping and spending time with her grandchildren. She has 2 grandchildren ([name] age 83 and [name] age 59). She also has a step grandchild, [name], age 3.    Family History  Problem Relation Age of Onset  . Arthritis Mother   . Hyperlipidemia Mother   . Hypertension Mother   . CAD Mother   . Cancer Maternal Grandmother 24    Breast and unsure uterine/ovary?  . Cancer Paternal Aunt 23    breast    ROS: no fevers or chills, productive cough, hemoptysis, dysphasia, odynophagia, melena, hematochezia, dysuria, hematuria, rash, seizure activity, orthopnea, PND, pedal edema, claudication. Remaining systems are negative.  Physical Exam: Well-developed well-nourished in no acute distress.  Skin is warm and dry.  HEENT is normal.  Neck is supple.  Chest is clear to auscultation with normal expansion.  Cardiovascular exam is regular rate and rhythm.  Abdominal exam nontender or distended. No masses palpated. Extremities show no edema. neuro grossly intact  ECG-11/16/15-NSR, no ST changes  A/P  1 Palpitations-symptoms are controlled reasonably well with Toprol. We will continue.  2 Chest pain-symptoms are atypical but she is concerned. We will plan an exercise treadmill for risk stratification.  Kirk Ruths, MD

## 2015-11-20 ENCOUNTER — Ambulatory Visit (INDEPENDENT_AMBULATORY_CARE_PROVIDER_SITE_OTHER): Payer: BLUE CROSS/BLUE SHIELD | Admitting: Cardiology

## 2015-11-20 ENCOUNTER — Encounter: Payer: Self-pay | Admitting: Cardiology

## 2015-11-20 VITALS — BP 122/78 | HR 88 | Ht 63.0 in | Wt 178.2 lb

## 2015-11-20 DIAGNOSIS — R079 Chest pain, unspecified: Secondary | ICD-10-CM | POA: Diagnosis not present

## 2015-11-20 DIAGNOSIS — R002 Palpitations: Secondary | ICD-10-CM

## 2015-11-20 NOTE — Patient Instructions (Addendum)
Medication Instructions:   NO CHANGE  Testing/Procedures:  Your physician has requested that you have an exercise tolerance test. For further information please visit [email or web address]. Please also follow instruction sheet, as given. DO NOT TAKE METOPROLOL AM OF TESTING    Follow-Up:  Your physician wants you to follow-up in: [place] will receive a reminder letter in the mail two months in advance. If you don't receive a letter, please call our office to schedule the follow-up appointment.   If you need a refill on your cardiac medications before your next appointment, please call your pharmacy.    Exercise Stress Electrocardiogram An exercise stress electrocardiogram is a test to check how blood flows to your heart. It is done to find areas of poor blood flow. You will need to walk on a treadmill for this test. The electrocardiogram will record your heartbeat when you are at rest and when you are exercising. BEFORE THE PROCEDURE  Do not have drinks with caffeine or foods with caffeine for 24 hours before the test, or as told by your doctor. This includes coffee, tea (even decaf tea), sodas, chocolate, and cocoa.  Follow your doctor's instructions about eating and drinking before the test.  Ask your doctor what medicines you should or should not take before the test. Take your medicines with water unless told by your doctor not to.  If you use an inhaler, bring it with you to the test.  Bring a snack to eat after the test.  Do not  smoke for 4 hours before the test.  Do not put lotions, powders, creams, or oils on your chest before the test.  Wear comfortable shoes and clothing. PROCEDURE  You will have patches put on your chest. Small areas of your chest may need to be shaved. Wires will be connected to the patches.  Your heart rate will be watched while you are resting and while you are exercising.  You will walk on the treadmill. The treadmill will  slowly get faster to raise your heart rate.  The test will take about 1-2 hours. AFTER THE PROCEDURE  Your heart rate and blood pressure will be watched after the test.  You may return to your normal diet, activities, and medicines or as told by your doctor.   This information is not intended to replace advice given to you by your health care provider. Make sure you discuss any questions you have with your health care provider.   Document Released: 06/29/2007 Document Revised: 01/31/2014 Document Reviewed: 09/17/2012 Elsevier Interactive Patient Education [employer].

## 2015-12-04 ENCOUNTER — Telehealth (HOSPITAL_COMMUNITY): Payer: Self-pay

## 2015-12-04 NOTE — Telephone Encounter (Signed)
Encounter complete. 

## 2015-12-09 ENCOUNTER — Ambulatory Visit (HOSPITAL_COMMUNITY)
Admission: RE | Admit: 2015-12-09 | Discharge: 2015-12-09 | Disposition: A | Discharge status: Home / Self Care | Payer: BLUE CROSS/BLUE SHIELD | Source: Ambulatory Visit | Attending: Cardiology | Admitting: Cardiology

## 2015-12-09 DIAGNOSIS — R079 Chest pain, unspecified: Secondary | ICD-10-CM

## 2015-12-09 LAB — EXERCISE TOLERANCE TEST
CSEPED: 9 min
CSEPEW: 10.1 METS
CSEPHR: 101 %
CSEPPHR: 169 {beats}/min
Exercise duration (sec): 0 s
MPHR: 167 {beats}/min
RPE: 17
Rest HR: 74 {beats}/min

## 2016-05-08 DIAGNOSIS — J0111 Acute recurrent frontal sinusitis: Secondary | ICD-10-CM | POA: Diagnosis not present

## 2016-05-08 DIAGNOSIS — J218 Acute bronchiolitis due to other specified organisms: Secondary | ICD-10-CM | POA: Diagnosis not present

## 2016-05-16 ENCOUNTER — Other Ambulatory Visit: Payer: Self-pay | Admitting: Obstetrics and Gynecology

## 2016-05-16 DIAGNOSIS — Z1231 Encounter for screening mammogram for malignant neoplasm of breast: Secondary | ICD-10-CM

## 2016-06-11 ENCOUNTER — Other Ambulatory Visit: Payer: Self-pay | Admitting: Cardiology

## 2016-06-13 NOTE — Telephone Encounter (Signed)
Rx request sent to pharmacy.  

## 2016-06-22 ENCOUNTER — Ambulatory Visit
Admission: RE | Admit: 2016-06-22 | Discharge: 2016-06-22 | Disposition: A | Discharge status: Home / Self Care | Payer: BLUE CROSS/BLUE SHIELD | Source: Ambulatory Visit | Attending: Obstetrics and Gynecology | Admitting: Obstetrics and Gynecology

## 2016-06-22 DIAGNOSIS — Z1231 Encounter for screening mammogram for malignant neoplasm of breast: Secondary | ICD-10-CM

## 2016-07-11 DIAGNOSIS — Z124 Encounter for screening for malignant neoplasm of cervix: Secondary | ICD-10-CM | POA: Diagnosis not present

## 2016-07-11 DIAGNOSIS — Z6832 Body mass index (BMI) 32.0-32.9, adult: Secondary | ICD-10-CM | POA: Diagnosis not present

## 2016-07-11 DIAGNOSIS — Z01419 Encounter for gynecological examination (general) (routine) without abnormal findings: Secondary | ICD-10-CM | POA: Diagnosis not present

## 2016-08-06 DIAGNOSIS — J0100 Acute maxillary sinusitis, unspecified: Secondary | ICD-10-CM | POA: Diagnosis not present

## 2016-08-06 DIAGNOSIS — R05 Cough: Secondary | ICD-10-CM | POA: Diagnosis not present

## 2016-08-06 DIAGNOSIS — R0602 Shortness of breath: Secondary | ICD-10-CM | POA: Diagnosis not present

## 2016-09-05 DIAGNOSIS — K5900 Constipation, unspecified: Secondary | ICD-10-CM | POA: Diagnosis not present

## 2016-09-05 DIAGNOSIS — R1030 Lower abdominal pain, unspecified: Secondary | ICD-10-CM | POA: Diagnosis not present

## 2016-09-05 DIAGNOSIS — K625 Hemorrhage of anus and rectum: Secondary | ICD-10-CM | POA: Diagnosis not present

## 2016-11-17 NOTE — Progress Notes (Signed)
      HPI: FU palpitations. Monitor March 2017 showed sinus with occasional PACs, brief PAT, rare PVC and isolated couplet. Echocardiogram March 2017 showed vigorous LV systolic function. Exercise treadmill November 2017 negative. Since last seen the patient has dyspnea with more extreme activities but not with routine activities. It is relieved with rest. It is not associated with chest pain. There is no orthopnea, PND or pedal edema. There is no syncope or palpitations. There is no exertional chest pain.   Current Outpatient Prescriptions  Medication Sig Dispense Refill  . cefdinir (OMNICEF) 300 MG capsule Take 300 mg by mouth 2 (two) times daily.    . meloxicam (MOBIC) 15 MG tablet Take 15 mg by mouth daily as needed.     . metoprolol succinate (TOPROL-XL) 25 MG 24 hr tablet TAKE 1 TABLET (25 MG TOTAL) BY MOUTH DAILY. 90 tablet 3   No current facility-administered medications for this visit.      Past Medical History:  Diagnosis Date  . GERD (gastroesophageal reflux disease)   . Murmur   . Urine incontinence   . UTI (lower urinary tract infection)     Past Surgical History:  Procedure Laterality Date  . CESAREAN SECTION  1981  . TUBAL LIGATION  2006    Social History   Social History  . Marital status: Married    Spouse name: [name]  . Number of children: 1  . 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 Smoking status: Never Smoker  . Smokeless tobacco: Never Used  . Alcohol use No  . Drug use: No  . Sexual activity: Yes    Birth control/ protection: Post-menopausal   Other Topics Concern  . Not on file   [place] is currently living in [place] with her husband, [name], of 15 years. She has 1 daughter from a previous marriage. They have a dog. She works in Therapist, art for a Psychiatrist. She enjoys shopping and spending time with her  grandchildren. She has 2 grandchildren ([name] age 53 and [name] age 59). She also has a step grandchild, [name], age 18.    Family History  Problem Relation Age of Onset  . Arthritis Mother   . Hyperlipidemia Mother   . Hypertension Mother   . CAD Mother   . Cancer Maternal Grandmother 87       Breast and unsure uterine/ovary?  . Cancer Paternal Aunt 18       breast    ROS: no fevers or chills, productive cough, hemoptysis, dysphasia, odynophagia, melena, hematochezia, dysuria, hematuria, rash, seizure activity, orthopnea, PND, pedal edema, claudication. Remaining systems are negative.  Physical Exam: Well-developed well-nourished in no acute distress.  Skin is warm and dry.  HEENT is normal.  Neck is supple.  Chest is clear to auscultation with normal expansion.  Cardiovascular exam is regular rate and rhythm.  Abdominal exam nontender or distended. No masses palpated. Extremities show no edema. neuro grossly intact  ECG- sinus rhythm at a rate of 69. No ST changes.personally reviewed  A/P  1 palpitations-patient symptoms are reasonably well controlled with present dose of Toprol. We will continue.  2 chest pain-previous symptoms atypical and exercise treadmill negative.  Kirk Ruths, MD

## 2016-11-22 DIAGNOSIS — J0100 Acute maxillary sinusitis, unspecified: Secondary | ICD-10-CM | POA: Diagnosis not present

## 2016-11-25 ENCOUNTER — Encounter: Payer: Self-pay | Admitting: Cardiology

## 2016-11-25 ENCOUNTER — Ambulatory Visit (INDEPENDENT_AMBULATORY_CARE_PROVIDER_SITE_OTHER): Payer: BLUE CROSS/BLUE SHIELD | Admitting: Cardiology

## 2016-11-25 VITALS — BP 120/82 | HR 69 | Ht 64.0 in | Wt 182.6 lb

## 2016-11-25 DIAGNOSIS — R079 Chest pain, unspecified: Secondary | ICD-10-CM | POA: Diagnosis not present

## 2016-11-25 DIAGNOSIS — R002 Palpitations: Secondary | ICD-10-CM | POA: Diagnosis not present

## 2016-11-25 MED ORDER — METOPROLOL SUCCINATE ER 25 MG PO TB24: 25.0000 mg | ORAL_TABLET | Freq: Every day | ORAL | 3 refills | Status: DC

## 2016-11-25 NOTE — Patient Instructions (Signed)
Your physician wants you to follow-up in: ONE YEAR WITH DR CRENSHAW You will receive a reminder letter in the mail two months in advance. If you don't receive a letter, please call our office to schedule the follow-up appointment.   If you need a refill on your cardiac medications before your next appointment, please call your pharmacy.  

## 2016-12-13 DIAGNOSIS — J0100 Acute maxillary sinusitis, unspecified: Secondary | ICD-10-CM | POA: Diagnosis not present

## 2016-12-13 DIAGNOSIS — R51 Headache: Secondary | ICD-10-CM | POA: Diagnosis not present

## 2017-03-03 DIAGNOSIS — K529 Noninfective gastroenteritis and colitis, unspecified: Secondary | ICD-10-CM | POA: Diagnosis not present

## 2017-04-05 DIAGNOSIS — J209 Acute bronchitis, unspecified: Secondary | ICD-10-CM | POA: Diagnosis not present

## 2017-04-05 DIAGNOSIS — J019 Acute sinusitis, unspecified: Secondary | ICD-10-CM | POA: Diagnosis not present

## 2017-06-06 ENCOUNTER — Other Ambulatory Visit: Payer: Self-pay | Admitting: Obstetrics and Gynecology

## 2017-06-06 DIAGNOSIS — Z1231 Encounter for screening mammogram for malignant neoplasm of breast: Secondary | ICD-10-CM

## 2017-06-20 DIAGNOSIS — L989 Disorder of the skin and subcutaneous tissue, unspecified: Secondary | ICD-10-CM | POA: Diagnosis not present

## 2017-06-20 DIAGNOSIS — N3001 Acute cystitis with hematuria: Secondary | ICD-10-CM | POA: Diagnosis not present

## 2017-06-20 DIAGNOSIS — N3090 Cystitis, unspecified without hematuria: Secondary | ICD-10-CM | POA: Diagnosis not present

## 2017-07-03 ENCOUNTER — Ambulatory Visit
Admission: RE | Admit: 2017-07-03 | Discharge: 2017-07-03 | Disposition: A | Discharge status: Home / Self Care | Payer: BLUE CROSS/BLUE SHIELD | Source: Ambulatory Visit | Attending: Obstetrics and Gynecology | Admitting: Obstetrics and Gynecology

## 2017-07-03 DIAGNOSIS — Z1231 Encounter for screening mammogram for malignant neoplasm of breast: Secondary | ICD-10-CM

## 2017-07-03 DIAGNOSIS — C44729 Squamous cell carcinoma of skin of left lower limb, including hip: Secondary | ICD-10-CM | POA: Diagnosis not present

## 2017-07-24 DIAGNOSIS — C44729 Squamous cell carcinoma of skin of left lower limb, including hip: Secondary | ICD-10-CM | POA: Diagnosis not present

## 2017-08-11 DIAGNOSIS — Z01419 Encounter for gynecological examination (general) (routine) without abnormal findings: Secondary | ICD-10-CM | POA: Diagnosis not present

## 2017-08-11 DIAGNOSIS — Z6832 Body mass index (BMI) 32.0-32.9, adult: Secondary | ICD-10-CM | POA: Diagnosis not present

## 2017-08-11 DIAGNOSIS — Z124 Encounter for screening for malignant neoplasm of cervix: Secondary | ICD-10-CM | POA: Diagnosis not present

## 2017-09-05 DIAGNOSIS — L821 Other seborrheic keratosis: Secondary | ICD-10-CM | POA: Diagnosis not present

## 2017-09-05 DIAGNOSIS — Z85828 Personal history of other malignant neoplasm of skin: Secondary | ICD-10-CM | POA: Diagnosis not present

## 2017-09-05 DIAGNOSIS — L814 Other melanin hyperpigmentation: Secondary | ICD-10-CM | POA: Diagnosis not present

## 2017-09-05 DIAGNOSIS — L570 Actinic keratosis: Secondary | ICD-10-CM | POA: Diagnosis not present

## 2017-09-05 DIAGNOSIS — D225 Melanocytic nevi of trunk: Secondary | ICD-10-CM | POA: Diagnosis not present

## 2017-09-05 DIAGNOSIS — L820 Inflamed seborrheic keratosis: Secondary | ICD-10-CM | POA: Diagnosis not present

## 2017-10-27 ENCOUNTER — Encounter: Payer: Self-pay | Admitting: Gastroenterology

## 2017-11-30 ENCOUNTER — Encounter: Payer: Self-pay | Admitting: Gastroenterology

## 2017-11-30 NOTE — Progress Notes (Signed)
HPI: FU palpitations. Monitor March 2017 showed sinus with occasional PACs, brief PAT, rare PVC and isolated couplet. Echocardiogram March 2017 showed vigorous LV systolic function. Exercise treadmill November 2017 negative. Since last seen  she has a rare skip but no sustained palpitations.  She denies dyspnea or chest pain.  Current Outpatient Medications  Medication Sig Dispense Refill  . metoprolol succinate (TOPROL-XL) 25 MG 24 hr tablet TAKE 1 TABLET BY MOUTH EVERY DAY 90 tablet 1   No current facility-administered medications for this visit.      Past Medical History:  Diagnosis Date  . GERD (gastroesophageal reflux disease)   . Murmur   . Palpitations   . Urine incontinence   . UTI (lower urinary tract infection)     Past Surgical History:  Procedure Laterality Date  . CESAREAN SECTION  1981  . TUBAL LIGATION  2006    Social History   Socioeconomic History  . Marital status: Married    Spouse name: [name]  . Number of children: 1  . Years of education: 82  . Highest education level: Not on file  Occupational History  . Occupation: Therapist, art    Comment: [employer]  Social Needs  . Financial resource strain: Not on file  . Food insecurity:    Worry: Not on file    Inability: Not on file  . Transportation needs:    Medical: Not on file    Non-medical: Not on file  Tobacco Use  . Smoking status: Never Smoker  . Smokeless tobacco: Never Used  Substance and Sexual Activity  . Alcohol use: No  . Drug use: No  . Sexual activity: Yes    Birth control/protection: Post-menopausal  Lifestyle  . Physical activity:    Days per week: Not on file    Minutes per session: Not on file  . Stress: Not on file  Relationships  . Social connections:    Talks on phone: Not on file    Gets together: Not on file    Attends religious service: Not on file    Active member of club or organization: Not on file    Attends meetings of clubs or  organizations: Not on file    Relationship status: Not on file  . Intimate partner violence:    Fear of current or ex partner: Not on file    Emotionally abused: Not on file    Physically abused: Not on file    Forced sexual activity: Not on file  Other Topics Concern  . Not on file  [place] is currently living in [place] with her husband, [name], of 15 years. She has 1 daughter from a previous marriage. They have a dog. She works in Therapist, art for a Psychiatrist. She enjoys shopping and spending time with her grandchildren. She has 2 grandchildren ([name] age 65 and [name] age 79). She also has a step grandchild, [name], age 76.    Family History  Problem Relation Age of Onset  . Arthritis Mother   . Hyperlipidemia Mother   . Hypertension Mother   . CAD Mother   . Cancer Maternal Grandmother 7       Breast and unsure uterine/ovary?  . Cancer Paternal Aunt 52       breast    ROS: no fevers or chills, productive cough, hemoptysis, dysphasia, odynophagia, melena, hematochezia, dysuria, hematuria, rash, seizure activity, orthopnea, PND, pedal edema, claudication. Remaining systems  are negative.  Physical Exam: Well-developed well-nourished in no acute distress.  Skin is warm and dry.  HEENT is normal.  Neck is supple.  Chest is clear to auscultation with normal expansion.  Cardiovascular exam is regular rate and rhythm.  Abdominal exam nontender or distended. No masses palpated. Extremities show no edema. neuro grossly intact  ECG-sinus rhythm at a rate of 74.  No ST changes.  Personally reviewed  A/P  1 palpitations-patient symptoms are well controlled at present.  Continue present dose of beta-blocker.  2 history of chest pain-symptoms have been felt to be atypical in the past.  Previous exercise treadmill normal.  We will not pursue further evaluation at this point.  Kirk Ruths, MD

## 2017-12-05 DIAGNOSIS — Z85828 Personal history of other malignant neoplasm of skin: Secondary | ICD-10-CM | POA: Diagnosis not present

## 2017-12-05 DIAGNOSIS — L570 Actinic keratosis: Secondary | ICD-10-CM | POA: Diagnosis not present

## 2017-12-05 DIAGNOSIS — L905 Scar conditions and fibrosis of skin: Secondary | ICD-10-CM | POA: Diagnosis not present

## 2017-12-09 ENCOUNTER — Other Ambulatory Visit: Payer: Self-pay | Admitting: Cardiology

## 2017-12-09 DIAGNOSIS — R002 Palpitations: Secondary | ICD-10-CM

## 2017-12-12 ENCOUNTER — Encounter: Payer: Self-pay | Admitting: Cardiology

## 2017-12-12 ENCOUNTER — Ambulatory Visit: Payer: BLUE CROSS/BLUE SHIELD | Admitting: Cardiology

## 2017-12-12 VITALS — BP 122/62 | HR 74 | Ht 61.0 in | Wt 182.0 lb

## 2017-12-12 DIAGNOSIS — R002 Palpitations: Secondary | ICD-10-CM

## 2017-12-12 DIAGNOSIS — R072 Precordial pain: Secondary | ICD-10-CM | POA: Diagnosis not present

## 2017-12-12 MED ORDER — METOPROLOL SUCCINATE ER 25 MG PO TB24: 25.0000 mg | ORAL_TABLET | Freq: Every day | ORAL | 3 refills | Status: DC

## 2017-12-12 NOTE — Patient Instructions (Signed)
Medication Instructions:  Refill sent to the pharmacy electronically.  If you need a refill on your cardiac medications before your next appointment, please call your pharmacy.   Lab work: If you have labs (blood work) drawn today and your tests are completely normal, you will receive your results only by: . MyChart Message (if you have MyChart) OR . A paper copy in the mail If you have any lab test that is abnormal or we need to change your treatment, we will call you to review the results.  Follow-Up: At [facility], you and your health needs are our priority.  As part of our continuing mission to provide you with exceptional heart care, we have created designated Provider Care Teams.  These Care Teams include your primary Cardiologist (physician) and Advanced Practice Providers (APPs -  Physician Assistants and Nurse Practitioners) who all work together to provide you with the care you need, when you need it. You will need a follow up appointment in 12 months.  Please call our office 2 months in advance to schedule this appointment.  You may see BRIAN CRENSHAW MD or one of the following Advanced Practice Providers on your designated Care Team:   Luke Kilroy, PA-C Krista Kroeger, PA-C . Callie Goodrich, PA-C     

## 2018-01-02 ENCOUNTER — Encounter: Payer: Self-pay | Admitting: Gastroenterology

## 2018-01-02 ENCOUNTER — Other Ambulatory Visit: Payer: Self-pay

## 2018-01-02 ENCOUNTER — Ambulatory Visit (AMBULATORY_SURGERY_CENTER): Payer: Self-pay | Admitting: *Deleted

## 2018-01-02 VITALS — Ht 63.0 in | Wt 183.2 lb

## 2018-01-02 DIAGNOSIS — Z86010 Personal history of colonic polyps: Secondary | ICD-10-CM

## 2018-01-02 MED ORDER — SOD PICOSULFATE-MAG OX-CIT ACD 10-3.5-12 MG-GM -GM/160ML PO SOLN: 1.0000 | Freq: Once | ORAL | 0 refills | Status: AC

## 2018-01-02 NOTE — Progress Notes (Signed)
Patient denies any allergies to egg or soy products. Patient denies complications with anesthesia/sedation.  Patient denies oxygen use at home and denies diet medications. Patient denies information on colonoscopy. 

## 2018-01-15 ENCOUNTER — Ambulatory Visit (AMBULATORY_SURGERY_CENTER): Payer: BLUE CROSS/BLUE SHIELD | Admitting: Gastroenterology

## 2018-01-15 ENCOUNTER — Encounter: Payer: Self-pay | Admitting: Gastroenterology

## 2018-01-15 VITALS — BP 127/74 | HR 72 | Temp 99.1°F | Resp 16 | Ht 61.0 in | Wt 182.0 lb

## 2018-01-15 DIAGNOSIS — Z1211 Encounter for screening for malignant neoplasm of colon: Secondary | ICD-10-CM | POA: Diagnosis not present

## 2018-01-15 DIAGNOSIS — Z86010 Personal history of colonic polyps: Secondary | ICD-10-CM

## 2018-01-15 MED ORDER — SODIUM CHLORIDE 0.9 % IV SOLN: 500.0000 mL | Freq: Once | INTRAVENOUS | Status: DC

## 2018-01-15 NOTE — Progress Notes (Signed)
To PACU, VSS. Report to [email or web address] 

## 2018-01-15 NOTE — Progress Notes (Signed)
Pt's states no medical or surgical changes since previsit or office visit. 

## 2018-01-15 NOTE — Op Note (Signed)
Mallory Sanchez Procedure Date: 01/15/2018 10:40 AM MRN: [id number] Endoscopist: Jackquline Denmark , MD Age: 57 Referring MD:  Date of Birth: August 15, 1960 Gender: Female Account #: [id number] Procedure:                Colonoscopy Indications:              High risk colon cancer surveillance: Personal                            history of colonic polyps Medicines:                Monitored Anesthesia Care Procedure:                Pre-Anesthesia Assessment:                           - Prior to the procedure, a History and Physical                            was performed, and patient medications and                            allergies were reviewed. The patient's tolerance of                            previous anesthesia was also reviewed. The risks                            and benefits of the procedure and the sedation                            options and risks were discussed with the patient.                            All questions were answered, and informed consent                            was obtained. Prior Anticoagulants: The patient has                            taken no previous anticoagulant or antiplatelet                            agents. ASA Grade Assessment: II - A patient with                            mild systemic disease. After reviewing the risks                            and benefits, the patient was deemed in                            satisfactory condition to undergo the procedure.  After obtaining informed consent, the colonoscope                            was passed under direct vision. Throughout the                            procedure, the patient's blood pressure, pulse, and                            oxygen saturations were monitored continuously. The                            Model CF-HQ190L [id number]) scope was introduced                            through the anus and advanced to the  2 cm into the                            ileum. The colonoscopy was performed without                            difficulty. The patient tolerated the procedure                            well. The quality of the bowel preparation was good. Scope In: 10:44:21 AM Scope Out: 10:56:24 AM Scope Withdrawal Time: 0 hours 7 minutes 9 seconds  Total Procedure Duration: 0 hours 12 minutes 3 seconds  Findings:                 A few small-mouthed diverticula were found in the                            sigmoid colon.                           Non-bleeding internal hemorrhoids were found during                            retroflexion. The hemorrhoids were small.                           The exam was otherwise without abnormality on                            direct and retroflexion views. Complications:            No immediate complications. Estimated Blood Loss:     Estimated blood loss: none. Impression:               -Mild sigmoid diverticulosis.                           -Small internal hemorrhoids.                           -Otherwise normal colonoscopy to TI. Recommendation:           -  Patient has a contact number available for                            emergencies. The signs and symptoms of potential                            delayed complications were discussed with the                            patient. Return to normal activities tomorrow.                            Written discharge instructions were provided to the                            patient.                           - Resume previous diet.                           - Continue present medications.                           - Repeat colonoscopy in 5 years for surveillance of                            previous history of colonic polyps..                           - Return to GI clinic PRN. Jackquline Denmark, MD 01/15/2018 11:01:42 AM This report has been signed electronically.

## 2018-01-15 NOTE — Patient Instructions (Addendum)
Thank you for allowing [place] to care for you today!  Diverticulosis handout provided.  Resume previous diet and medications today.  Return to normal activities tomorrow   Follow up colonoscopy in 5 years for surveillance.      YOU HAD AN ENDOSCOPIC PROCEDURE TODAY AT [facility]:   Refer to the procedure report that was given to you for any specific questions about what was found during the examination.  If the procedure report does not answer your questions, please call your gastroenterologist to clarify.  If you requested that your care partner not be given the details of your procedure findings, then the procedure report has been included in a sealed envelope for you to review at your convenience later.  YOU SHOULD EXPECT: Some feelings of bloating in the abdomen. Passage of more gas than usual.  Walking can help get rid of the air that was put into your GI tract during the procedure and reduce the bloating. If you had a lower endoscopy (such as a colonoscopy or flexible sigmoidoscopy) you may notice spotting of blood in your stool or on the toilet paper. If you underwent a bowel prep for your procedure, you may not have a normal bowel movement for a few days.  Please Note:  You might notice some irritation and congestion in your nose or some drainage.  This is from the oxygen used during your procedure.  There is no need for concern and it should clear up in a day or so.  SYMPTOMS TO REPORT IMMEDIATELY:   Following lower endoscopy (colonoscopy or flexible sigmoidoscopy):  Excessive amounts of blood in the stool  Significant tenderness or worsening of abdominal pains  Swelling of the abdomen that is new, acute  Fever of 100F or higher  For urgent or emergent issues, a gastroenterologist can be reached at any hour by calling [phone].   DIET:  We do recommend a small meal at first, but then you may proceed to your regular diet.  Drink plenty of fluids but you  should avoid alcoholic beverages for 24 hours.  ACTIVITY:  You should plan to take it easy for the rest of today and you should NOT DRIVE or use heavy machinery until tomorrow (because of the sedation medicines used during the test).    FOLLOW UP: Our staff will call the number listed on your records the next business day following your procedure to check on you and address any questions or concerns that you may have regarding the information given to you following your procedure. If we do not reach you, we will leave a message.  However, if you are feeling well and you are not experiencing any problems, there is no need to return our call.  We will assume that you have returned to your regular daily activities without incident.  If any biopsies were taken you will be contacted by phone or by letter within the next 1-3 weeks.  Please call us at [phone] if you have not heard about the biopsies in 3 weeks.    SIGNATURES/CONFIDENTIALITY: You and/or your care partner have signed paperwork which will be entered into your electronic medical record.  These signatures attest to the fact that that the information above on your After Visit Summary has been reviewed and is understood.  Full responsibility of the confidentiality of this discharge information lies with you and/or your care-partner.

## 2018-01-16 ENCOUNTER — Telehealth: Payer: Self-pay

## 2018-01-16 NOTE — Telephone Encounter (Signed)
  Follow up Call-  Call back number 01/15/2018  Post procedure Call Back phone  # [phone]  Permission to leave phone message Yes  Some recent data might be hidden     Patient questions:  Do you have a fever, pain , or abdominal swelling? No. Pain Score  0 *  Have you tolerated food without any problems? Yes.    Have you been able to return to your normal activities? Yes.    Do you have any questions about your discharge instructions: Diet   No. Medications  No. Follow up visit  No.  Do you have questions or concerns about your Care? No.  Actions: * If pain score is 4 or above: No action needed, pain <4.

## 2018-01-22 DIAGNOSIS — J0100 Acute maxillary sinusitis, unspecified: Secondary | ICD-10-CM | POA: Diagnosis not present

## 2018-06-28 ENCOUNTER — Other Ambulatory Visit: Payer: Self-pay | Admitting: Obstetrics and Gynecology

## 2018-06-28 DIAGNOSIS — Z1231 Encounter for screening mammogram for malignant neoplasm of breast: Secondary | ICD-10-CM

## 2018-08-17 ENCOUNTER — Ambulatory Visit
Admission: RE | Admit: 2018-08-17 | Discharge: 2018-08-17 | Disposition: A | Discharge status: Home / Self Care | Payer: BLUE CROSS/BLUE SHIELD | Source: Ambulatory Visit | Attending: Obstetrics and Gynecology | Admitting: Obstetrics and Gynecology

## 2018-08-17 ENCOUNTER — Other Ambulatory Visit: Payer: Self-pay

## 2018-08-17 DIAGNOSIS — Z1231 Encounter for screening mammogram for malignant neoplasm of breast: Secondary | ICD-10-CM

## 2018-09-04 DIAGNOSIS — L918 Other hypertrophic disorders of the skin: Secondary | ICD-10-CM | POA: Diagnosis not present

## 2018-09-04 DIAGNOSIS — L821 Other seborrheic keratosis: Secondary | ICD-10-CM | POA: Diagnosis not present

## 2018-09-04 DIAGNOSIS — L728 Other follicular cysts of the skin and subcutaneous tissue: Secondary | ICD-10-CM | POA: Diagnosis not present

## 2018-09-04 DIAGNOSIS — L570 Actinic keratosis: Secondary | ICD-10-CM | POA: Diagnosis not present

## 2018-09-04 DIAGNOSIS — D225 Melanocytic nevi of trunk: Secondary | ICD-10-CM | POA: Diagnosis not present

## 2018-09-19 DIAGNOSIS — Z124 Encounter for screening for malignant neoplasm of cervix: Secondary | ICD-10-CM | POA: Diagnosis not present

## 2018-09-19 DIAGNOSIS — Z01419 Encounter for gynecological examination (general) (routine) without abnormal findings: Secondary | ICD-10-CM | POA: Diagnosis not present

## 2018-09-19 DIAGNOSIS — Z6832 Body mass index (BMI) 32.0-32.9, adult: Secondary | ICD-10-CM | POA: Diagnosis not present

## 2018-12-28 ENCOUNTER — Ambulatory Visit: Payer: BC Managed Care – PPO | Admitting: Cardiology

## 2019-02-04 NOTE — Progress Notes (Signed)
HPI: FU palpitations. Monitor March 2017 showed sinus with occasional PACs, brief PAT, rare PVC and isolated couplet. Echocardiogram March 2017 showed vigorous LV systolic function.Exercise treadmill November 2017 negative.Since last seen  she has dyspnea with more vigorous activities.  No orthopnea, PND, chest pain or syncope.  Palpitations are well controlled.  Minimal pedal edema.  Current Outpatient Medications  Medication Sig Dispense Refill  . metoprolol succinate (TOPROL-XL) 25 MG 24 hr tablet Take 1 tablet (25 mg total) by mouth daily. 90 tablet 3   Current Facility-Administered Medications  Medication Dose Route Frequency Provider Last Rate Last Admin  . 0.9 %  sodium chloride infusion  500 mL Intravenous Once Jackquline Denmark, MD         Past Medical History:  Diagnosis Date  . Allergy   . 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 GERD (gastroesophageal reflux disease)    diet controlled - no meds  . Murmur    as a child - no problems as an adult  . Palpitations    tx w/metoprolol  . Urine incontinence   . UTI (lower urinary tract infection)     Past Surgical History:  Procedure Laterality Date  . bladder stretch     x 2  . CESAREAN SECTION  1981   x 1  . COLONOSCOPY     polyps/Gupta  . TUBAL LIGATION  2006    Social History   Socioeconomic History  . Marital status: Married    Spouse name: [name]  . Number of children: 1  . Years of education: 76  . Highest education level: Not on file  Occupational History  . 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 Smoking status: Never Smoker  . Smokeless tobacco: Never Used  Substance and Sexual Activity  . Alcohol use: No  . Drug use: No  . Sexual activity: Yes    Birth control/protection: Post-menopausal  Other Topics Concern  . Not on file  [place] is currently living in [place] with her husband, [name], of 15  years. She has 1 daughter from a previous marriage. They have a dog. She works in Therapist, art for a Psychiatrist. She enjoys shopping and spending time with her grandchildren. She has 2 grandchildren ([name] age 56 and [name] age 77). She also has a step grandchild, [name], age 43.   Social Determinants of Health   Financial Resource Strain:   . Difficulty of Paying Living Expenses: Not on file  Food Insecurity:   . Worried About Charity fundraiser in the Last Year: Not on file  . Ran Out of Food in the Last Year: Not on file  Transportation Needs:   . Lack of Transportation (Medical): Not on file  . Lack of Transportation (Non-Medical): Not on file  Physical Activity:   . Days of Exercise per Week: Not on file  . Minutes of Exercise per Session: Not on file  Stress:   . Feeling of Stress : Not on file  Social Connections:   . Frequency of Communication with Friends and Family: Not on file  . Frequency of Social Gatherings with Friends and Family: Not on file  . Attends Religious Services: Not on file  . Active Member of Clubs or Organizations: Not on file  . Attends Archivist Meetings: Not on file  . Marital Status: Not  on file  Intimate Partner Violence:   . Fear of Current or Ex-Partner: Not on file  . Emotionally Abused: Not on file  . Physically Abused: Not on file  . Sexually Abused: Not on file    Family History  Problem Relation Age of Onset  . Arthritis Mother   . Hyperlipidemia Mother   . Hypertension Mother   . CAD Mother   . Pancreatic cancer Mother   . Cancer Maternal Grandmother 20       Breast and unsure uterine/ovary?  . Breast cancer Maternal Grandmother   . Cancer Paternal Aunt 62       breast  . Breast cancer Paternal Aunt   . Colon cancer Neg Hx   . Rectal cancer Neg Hx   . Stomach cancer Neg Hx   . Esophageal cancer Neg Hx     ROS: no fevers or chills, productive cough, hemoptysis, dysphasia, odynophagia, melena, hematochezia,  dysuria, hematuria, rash, seizure activity, orthopnea, PND, claudication. Remaining systems are negative.  Physical Exam: Well-developed well-nourished in no acute distress.  Skin is warm and dry.  HEENT is normal.  Neck is supple.  Chest is clear to auscultation with normal expansion.  Cardiovascular exam is regular rate and rhythm.  Abdominal exam nontender or distended. No masses palpated. Extremities show no edema. neuro grossly intact  ECG-sinus rhythm at a rate of 79, no ST changes.  Personally reviewed  A/P  1 palpitations-patient symptoms are well controlled at present.  Continue beta-blocker at present dose.  2 history of chest pain-no recurrent symptoms.  Previous treadmill negative.  Kirk Ruths, MD

## 2019-02-07 ENCOUNTER — Encounter: Payer: Self-pay | Admitting: Cardiology

## 2019-02-07 ENCOUNTER — Other Ambulatory Visit: Payer: Self-pay

## 2019-02-07 ENCOUNTER — Ambulatory Visit: Payer: BC Managed Care – PPO | Admitting: Cardiology

## 2019-02-07 VITALS — BP 124/78 | HR 79 | Temp 96.8°F | Ht 62.0 in | Wt 185.0 lb

## 2019-02-07 DIAGNOSIS — R072 Precordial pain: Secondary | ICD-10-CM

## 2019-02-07 DIAGNOSIS — R002 Palpitations: Secondary | ICD-10-CM | POA: Diagnosis not present

## 2019-02-07 MED ORDER — METOPROLOL SUCCINATE ER 25 MG PO TB24: 25.0000 mg | ORAL_TABLET | Freq: Every day | ORAL | 3 refills | Status: DC

## 2019-02-07 NOTE — Patient Instructions (Signed)

## 2019-04-12 DIAGNOSIS — M25562 Pain in left knee: Secondary | ICD-10-CM | POA: Diagnosis not present

## 2019-04-18 DIAGNOSIS — M25551 Pain in right hip: Secondary | ICD-10-CM | POA: Diagnosis not present

## 2019-04-18 DIAGNOSIS — M1712 Unilateral primary osteoarthritis, left knee: Secondary | ICD-10-CM | POA: Diagnosis not present

## 2019-04-18 DIAGNOSIS — M5136 Other intervertebral disc degeneration, lumbar region: Secondary | ICD-10-CM | POA: Diagnosis not present

## 2019-04-18 DIAGNOSIS — M25562 Pain in left knee: Secondary | ICD-10-CM | POA: Diagnosis not present

## 2019-04-18 DIAGNOSIS — M25561 Pain in right knee: Secondary | ICD-10-CM | POA: Diagnosis not present

## 2019-04-18 DIAGNOSIS — M25552 Pain in left hip: Secondary | ICD-10-CM | POA: Diagnosis not present

## 2019-05-03 DIAGNOSIS — M1712 Unilateral primary osteoarthritis, left knee: Secondary | ICD-10-CM | POA: Diagnosis not present

## 2019-05-10 DIAGNOSIS — Z6832 Body mass index (BMI) 32.0-32.9, adult: Secondary | ICD-10-CM | POA: Diagnosis not present

## 2019-05-10 DIAGNOSIS — N909 Noninflammatory disorder of vulva and perineum, unspecified: Secondary | ICD-10-CM | POA: Diagnosis not present

## 2019-07-17 DIAGNOSIS — N764 Abscess of vulva: Secondary | ICD-10-CM | POA: Diagnosis not present

## 2019-07-17 DIAGNOSIS — Z6832 Body mass index (BMI) 32.0-32.9, adult: Secondary | ICD-10-CM | POA: Diagnosis not present

## 2019-07-24 DIAGNOSIS — T7840XA Allergy, unspecified, initial encounter: Secondary | ICD-10-CM | POA: Diagnosis not present

## 2019-07-24 DIAGNOSIS — L0291 Cutaneous abscess, unspecified: Secondary | ICD-10-CM | POA: Diagnosis not present

## 2019-07-25 DIAGNOSIS — Z6831 Body mass index (BMI) 31.0-31.9, adult: Secondary | ICD-10-CM | POA: Diagnosis not present

## 2019-07-25 DIAGNOSIS — N764 Abscess of vulva: Secondary | ICD-10-CM | POA: Diagnosis not present

## 2019-09-05 DIAGNOSIS — D485 Neoplasm of uncertain behavior of skin: Secondary | ICD-10-CM | POA: Diagnosis not present

## 2019-09-05 DIAGNOSIS — L570 Actinic keratosis: Secondary | ICD-10-CM | POA: Diagnosis not present

## 2019-09-05 DIAGNOSIS — D225 Melanocytic nevi of trunk: Secondary | ICD-10-CM | POA: Diagnosis not present

## 2019-09-05 DIAGNOSIS — L821 Other seborrheic keratosis: Secondary | ICD-10-CM | POA: Diagnosis not present

## 2019-09-05 DIAGNOSIS — L989 Disorder of the skin and subcutaneous tissue, unspecified: Secondary | ICD-10-CM | POA: Diagnosis not present

## 2019-09-05 DIAGNOSIS — L814 Other melanin hyperpigmentation: Secondary | ICD-10-CM | POA: Diagnosis not present

## 2019-09-05 DIAGNOSIS — L905 Scar conditions and fibrosis of skin: Secondary | ICD-10-CM | POA: Diagnosis not present

## 2019-09-10 ENCOUNTER — Ambulatory Visit
Admission: RE | Admit: 2019-09-10 | Discharge: 2019-09-10 | Disposition: A | Discharge status: Home / Self Care | Payer: BC Managed Care – PPO | Source: Ambulatory Visit | Attending: Obstetrics and Gynecology | Admitting: Obstetrics and Gynecology

## 2019-09-10 ENCOUNTER — Other Ambulatory Visit: Payer: Self-pay

## 2019-09-10 ENCOUNTER — Other Ambulatory Visit: Payer: Self-pay | Admitting: Obstetrics and Gynecology

## 2019-09-10 DIAGNOSIS — Z1231 Encounter for screening mammogram for malignant neoplasm of breast: Secondary | ICD-10-CM

## 2019-12-31 DIAGNOSIS — Z01419 Encounter for gynecological examination (general) (routine) without abnormal findings: Secondary | ICD-10-CM | POA: Diagnosis not present

## 2019-12-31 DIAGNOSIS — Z6833 Body mass index (BMI) 33.0-33.9, adult: Secondary | ICD-10-CM | POA: Diagnosis not present

## 2020-02-17 NOTE — Progress Notes (Deleted)
HPI: FU palpitations. Monitor March 2017 showed sinus with occasional PACs, brief PAT, rare PVC and isolated couplet. Echocardiogram March 2017 showed vigorous LV systolic function.Exercise treadmill November 2017 negative.Since last seen   Current Outpatient Medications  Medication Sig Dispense Refill  . metoprolol succinate (TOPROL-XL) 25 MG 24 hr tablet Take 1 tablet (25 mg total) by mouth daily. 90 tablet 3   Current Facility-Administered Medications  Medication Dose Route Frequency Provider Last Rate Last Admin  . 0.9 %  sodium chloride infusion  500 mL Intravenous Once Jackquline Denmark, MD         Past Medical History:  Diagnosis Date  . Allergy   . 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 GERD (gastroesophageal reflux disease)    diet controlled - no meds  . Murmur    as a child - no problems as an adult  . Palpitations    tx w/metoprolol  . Urine incontinence   . UTI (lower urinary tract infection)     Past Surgical History:  Procedure Laterality Date  . bladder stretch     x 2  . CESAREAN SECTION  1981   x 1  . COLONOSCOPY     polyps/Gupta  . TUBAL LIGATION  2006    Social History   Socioeconomic History  . Marital status: Married    Spouse name: [name]  . Number of children: 1  . Years of education: 32  . Highest education level: Not on file  Occupational History  . 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 Smoking status: Never Smoker  . Smokeless tobacco: Never Used  Vaping Use  . Vaping Use: Never used  Substance and Sexual Activity  . Alcohol use: No  . Drug use: No  . Sexual activity: Yes    Birth control/protection: Post-menopausal  Other Topics Concern  . Not on file  [place] is currently living in [place] with her husband, [name], of 15 years. She has 1 daughter from a previous marriage. They have a dog. She works in Therapist, art for a Geophysicist/field seismologist. She enjoys shopping and spending time with her grandchildren. She has 2 grandchildren ([name] age 53 and [name] age 83). She also has a step grandchild, [name], age 41.   Social Determinants of Health   Financial Resource Strain: Not on file  Food Insecurity: Not on file  Transportation Needs: Not on file  Physical Activity: Not on file  Stress: Not on file  Social Connections: Not on file  Intimate Partner Violence: Not on file    Family History  Problem Relation Age of Onset  . Arthritis Mother   . Hyperlipidemia Mother   . Hypertension Mother   . CAD Mother   . Pancreatic cancer Mother   . Cancer Maternal Grandmother 51       Breast and unsure uterine/ovary?  . Breast cancer Maternal Grandmother   . Cancer Paternal Aunt 37       breast  . Breast cancer Paternal Aunt   . Colon cancer Neg Hx   . Rectal cancer Neg Hx   . Stomach cancer Neg Hx   . Esophageal cancer Neg Hx     ROS: no fevers or chills, productive cough, hemoptysis, dysphasia, odynophagia, melena, hematochezia, dysuria, hematuria, rash, seizure activity, orthopnea, PND, pedal edema, claudication. Remaining systems are negative.  Physical Exam:  Well-developed well-nourished in no acute distress.  Skin is warm and dry.  HEENT is normal.  Neck is supple.  Chest is clear to auscultation with normal expansion.  Cardiovascular exam is regular rate and rhythm.  Abdominal exam nontender or distended. No masses palpated. Extremities show no edema. neuro grossly intact  ECG- personally reviewed  A/P  1 palpitations-symptoms continue to be controlled with present dose of beta-blocker.  We will continue.  2 history of chest pain-patient has had no recurrences.  Previous functional study unremarkable.  Kirk Ruths, MD

## 2020-02-26 DIAGNOSIS — Z20828 Contact with and (suspected) exposure to other viral communicable diseases: Secondary | ICD-10-CM | POA: Diagnosis not present

## 2020-02-26 DIAGNOSIS — J0100 Acute maxillary sinusitis, unspecified: Secondary | ICD-10-CM | POA: Diagnosis not present

## 2020-03-02 ENCOUNTER — Ambulatory Visit: Payer: BC Managed Care – PPO | Admitting: Cardiology

## 2020-03-07 DIAGNOSIS — J209 Acute bronchitis, unspecified: Secondary | ICD-10-CM | POA: Diagnosis not present

## 2020-03-07 DIAGNOSIS — J0100 Acute maxillary sinusitis, unspecified: Secondary | ICD-10-CM | POA: Diagnosis not present

## 2020-04-15 DIAGNOSIS — J069 Acute upper respiratory infection, unspecified: Secondary | ICD-10-CM | POA: Diagnosis not present

## 2020-04-15 DIAGNOSIS — R509 Fever, unspecified: Secondary | ICD-10-CM | POA: Diagnosis not present

## 2020-04-24 NOTE — Progress Notes (Signed)
HPI: FU palpitations. Monitor March 2017 showed sinus with occasional PACs, brief PAT, rare PVC and isolated couplet. Echocardiogram March 2017 showed vigorous LV systolic function.Exercise treadmill November 2017 negative.Since last seenthere is no dyspnea, chest pain or syncope.  Occasional brief flutters but no sustained palpitations.  Current Outpatient Medications  Medication Sig Dispense Refill  . metoprolol succinate (TOPROL-XL) 25 MG 24 hr tablet Take 1 tablet (25 mg total) by mouth daily. 90 tablet 3   Current Facility-Administered Medications  Medication Dose Route Frequency Provider Last Rate Last Admin  . 0.9 %  sodium chloride infusion  500 mL Intravenous Once Jackquline Denmark, MD         Past Medical History:  Diagnosis Date  . Allergy   . 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 GERD (gastroesophageal reflux disease)    diet controlled - no meds  . Murmur    as a child - no problems as an adult  . Palpitations    tx w/metoprolol  . Urine incontinence   . UTI (lower urinary tract infection)     Past Surgical History:  Procedure Laterality Date  . bladder stretch     x 2  . CESAREAN SECTION  1981   x 1  . COLONOSCOPY     polyps/Gupta  . TUBAL LIGATION  2006    Social History   Socioeconomic History  . Marital status: Married    Spouse name: [name]  . Number of children: 1  . Years of education: 44  . Highest education level: Not on file  Occupational History  . 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 Smoking status: Never Smoker  . Smokeless tobacco: Never Used  Vaping Use  . Vaping Use: Never used  Substance and Sexual Activity  . Alcohol use: No  . Drug use: No  . Sexual activity: Yes    Birth control/protection: Post-menopausal  Other Topics Concern  . Not on file  [place] is currently living in [place] with her husband, [name], of 15 years. She  has 1 daughter from a previous marriage. They have a dog. She works in Therapist, art for a Psychiatrist. She enjoys shopping and spending time with her grandchildren. She has 2 grandchildren ([name] age 22 and [name] age 36). She also has a step grandchild, [name], age 47.   Social Determinants of Health   Financial Resource Strain: Not on file  Food Insecurity: Not on file  Transportation Needs: Not on file  Physical Activity: Not on file  Stress: Not on file  Social Connections: Not on file  Intimate Partner Violence: Not on file    Family History  Problem Relation Age of Onset  . Arthritis Mother   . Hyperlipidemia Mother   . Hypertension Mother   . CAD Mother   . Pancreatic cancer Mother   . Cancer Maternal Grandmother 89       Breast and unsure uterine/ovary?  . Breast cancer Maternal Grandmother   . Cancer Paternal Aunt 94       breast  . Breast cancer Paternal Aunt   . Colon cancer Neg Hx   . Rectal cancer Neg Hx   . Stomach cancer Neg Hx   . Esophageal cancer Neg Hx     ROS: no fevers or chills, productive cough, hemoptysis, dysphasia, odynophagia, melena, hematochezia, dysuria, hematuria, rash,  seizure activity, orthopnea, PND, pedal edema, claudication. Remaining systems are negative.  Physical Exam: Well-developed well-nourished in no acute distress.  Skin is warm and dry.  HEENT is normal.  Neck is supple.  Chest is clear to auscultation with normal expansion.  Cardiovascular exam is regular rate and rhythm.  Abdominal exam nontender or distended. No masses palpated. Extremities show no edema. neuro grossly intact  ECG-normal sinus rhythm at a rate of 73, no ST changes.  Personally reviewed  A/P  1 palpitations-symptoms are controlled.  Continue beta-blocker.  2 prior history of chest pain-previous exercise treadmill unremarkable and she has had no recurrences.  Kirk Ruths, MD

## 2020-05-01 ENCOUNTER — Ambulatory Visit: Payer: BC Managed Care – PPO | Admitting: Cardiology

## 2020-05-01 ENCOUNTER — Other Ambulatory Visit: Payer: Self-pay

## 2020-05-01 ENCOUNTER — Encounter: Payer: Self-pay | Admitting: Cardiology

## 2020-05-01 VITALS — BP 130/74 | HR 73 | Ht 62.0 in | Wt 184.0 lb

## 2020-05-01 DIAGNOSIS — R002 Palpitations: Secondary | ICD-10-CM | POA: Diagnosis not present

## 2020-05-01 DIAGNOSIS — R072 Precordial pain: Secondary | ICD-10-CM

## 2020-05-01 MED ORDER — METOPROLOL SUCCINATE ER 25 MG PO TB24: 25.0000 mg | ORAL_TABLET | Freq: Every day | ORAL | 3 refills | Status: DC

## 2020-05-01 NOTE — Patient Instructions (Signed)

## 2020-08-31 ENCOUNTER — Other Ambulatory Visit: Payer: Self-pay | Admitting: Obstetrics and Gynecology

## 2020-08-31 DIAGNOSIS — Z1231 Encounter for screening mammogram for malignant neoplasm of breast: Secondary | ICD-10-CM

## 2020-09-23 ENCOUNTER — Other Ambulatory Visit: Payer: Self-pay

## 2020-09-23 ENCOUNTER — Ambulatory Visit
Admission: RE | Admit: 2020-09-23 | Discharge: 2020-09-23 | Disposition: A | Discharge status: Home / Self Care | Payer: BC Managed Care – PPO | Source: Ambulatory Visit | Attending: Obstetrics and Gynecology | Admitting: Obstetrics and Gynecology

## 2020-09-23 DIAGNOSIS — Z1231 Encounter for screening mammogram for malignant neoplasm of breast: Secondary | ICD-10-CM | POA: Diagnosis not present

## 2021-03-23 IMAGING — MG DIGITAL SCREENING BILATERAL MAMMOGRAM WITH TOMO AND CAD
8 series · 8 of 24 positions shown · non-contrast
Comparison: Previous exam(s).

CLINICAL DATA: Screening.

EXAM:
DIGITAL SCREENING BILATERAL MAMMOGRAM WITH TOMO AND CAD

[L CC synth-2D]
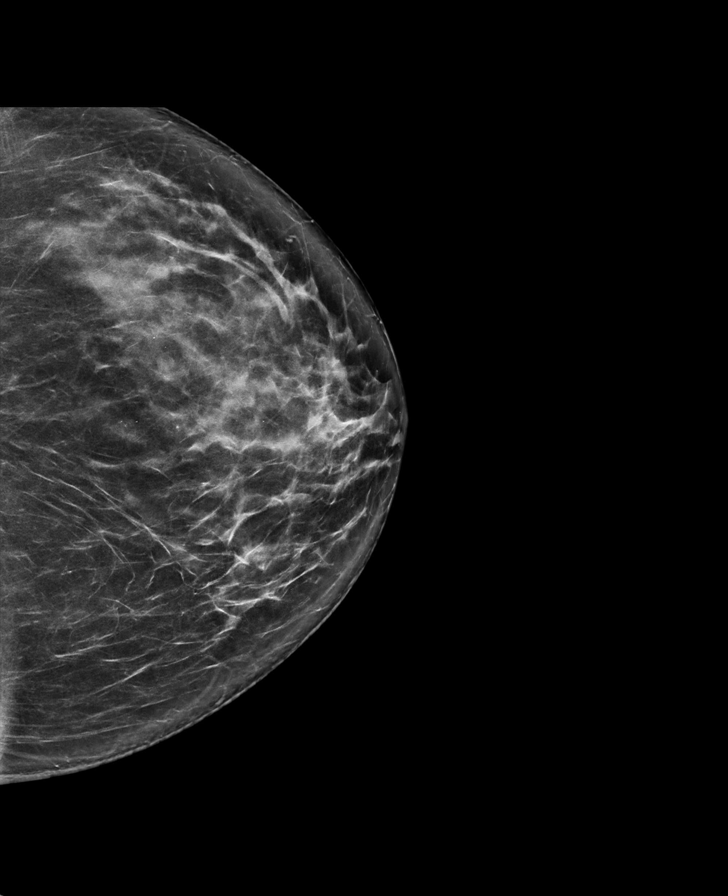

[R CC synth-2D]
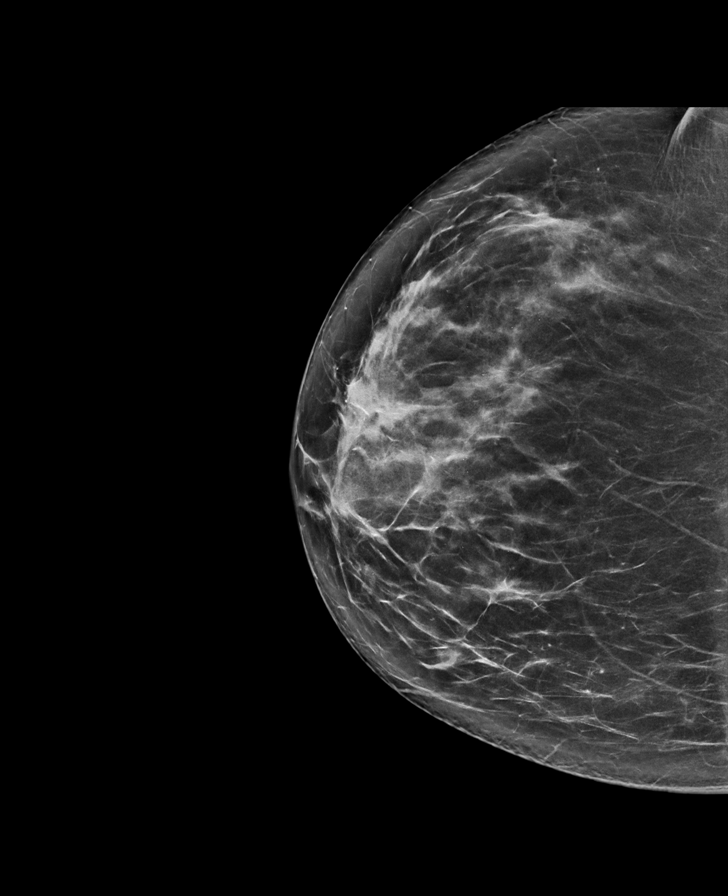

[R MLO synth-2D]
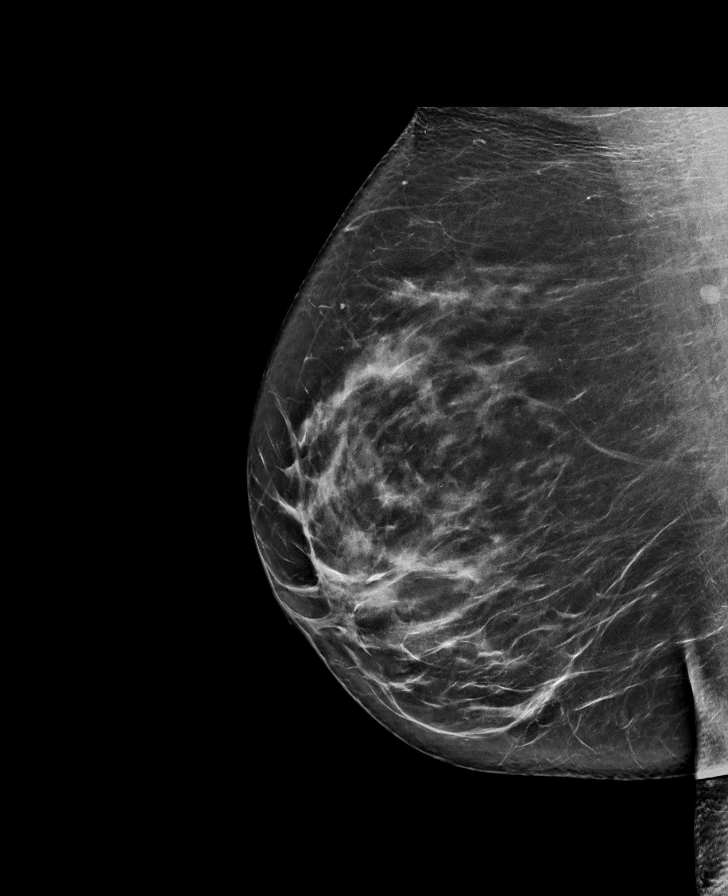

[L MLO synth-2D]
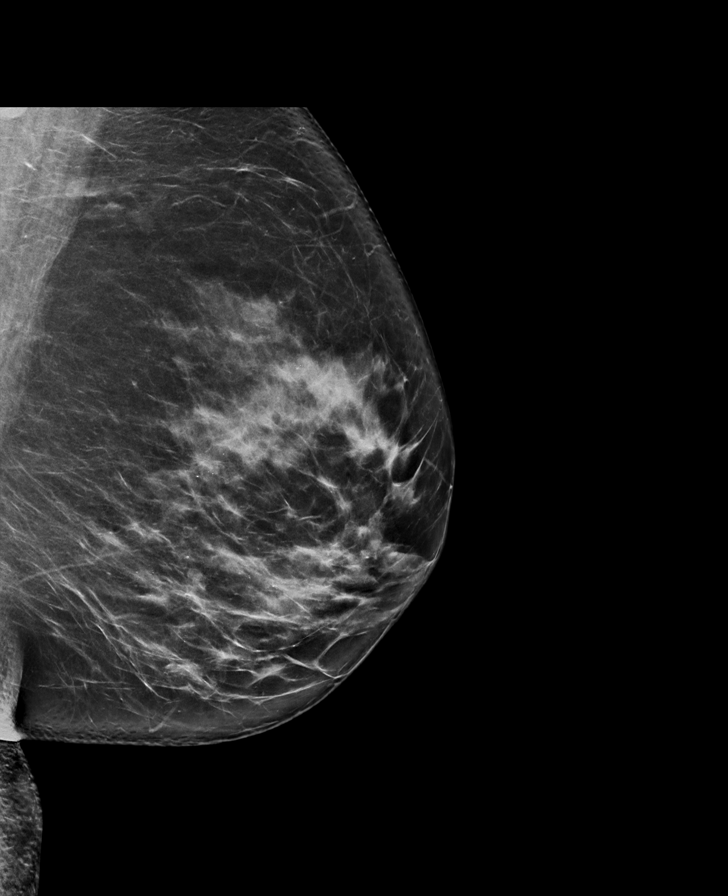

[R CC tomo · tomo slice 41/82.0]
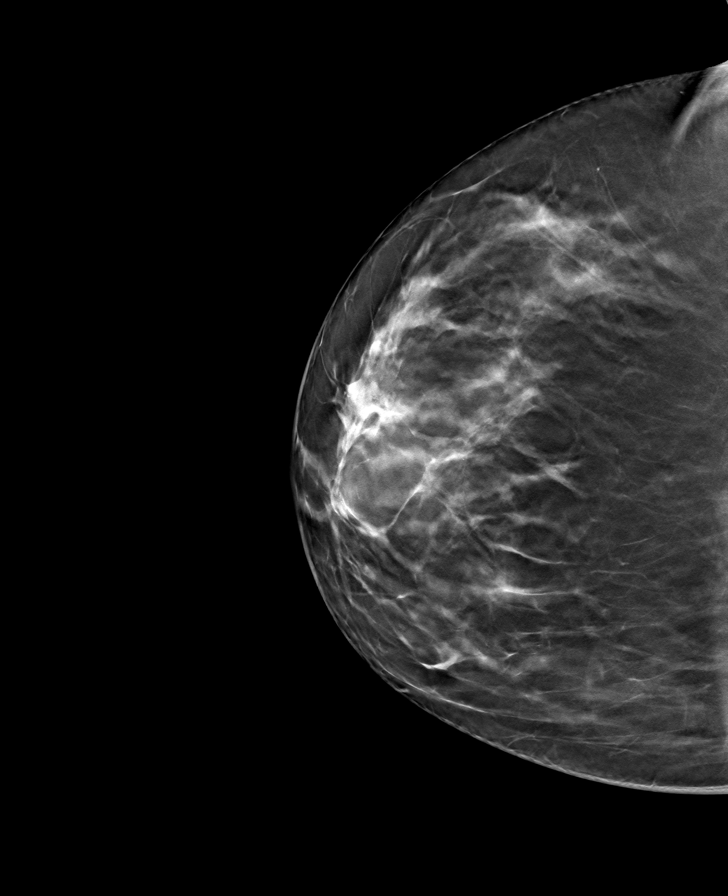

[L MLO tomo · tomo slice 44/87.0]
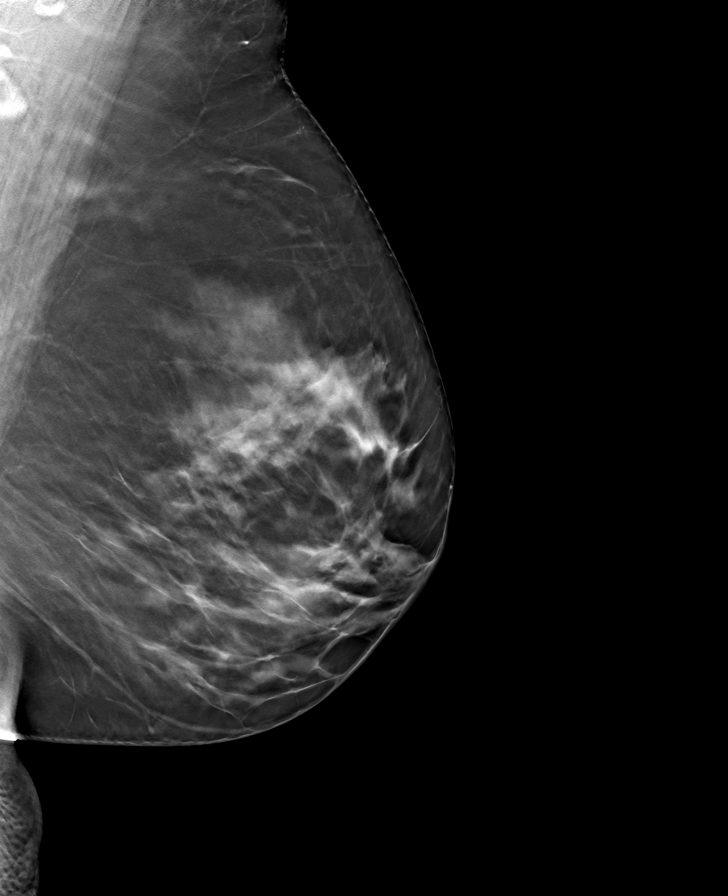

[L CC tomo · tomo slice 41/81.0]
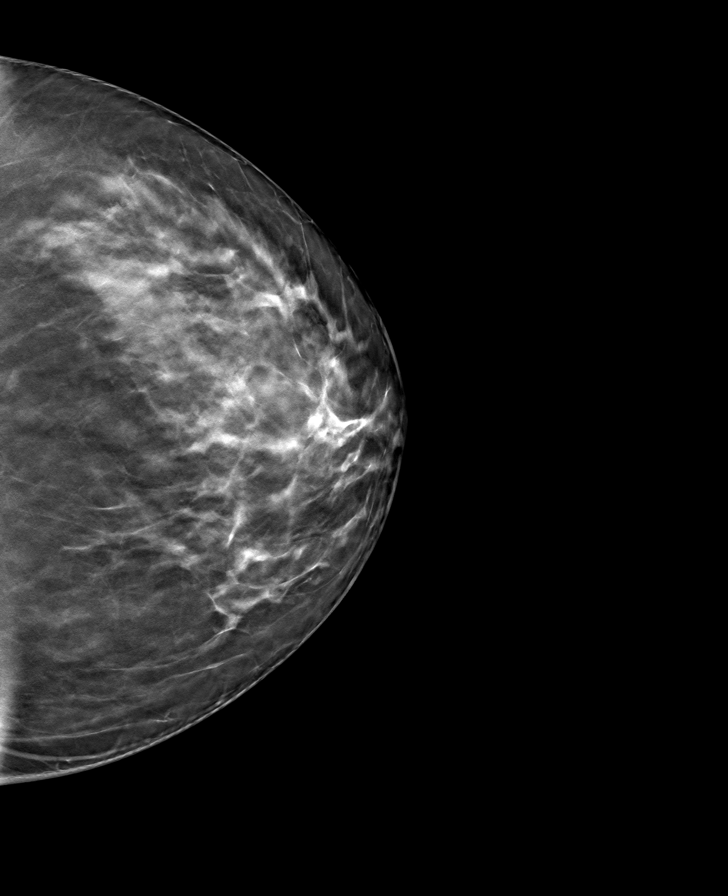

[R MLO tomo · tomo slice 44/87.0]
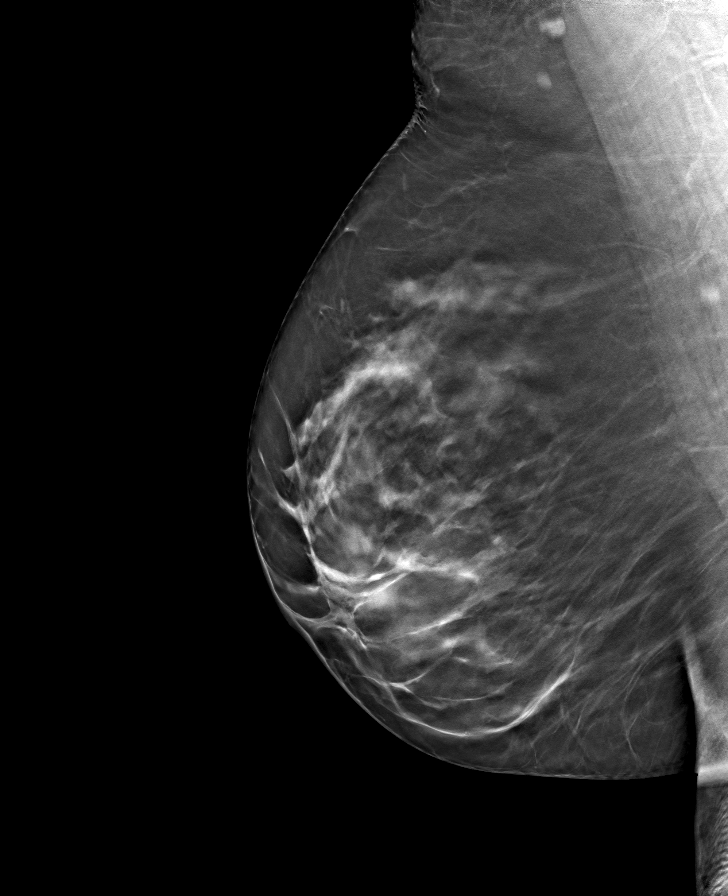

[8 of 24 positions shown; findings below may reference images not displayed]

ACR Breast Density Category c: The breast tissue is heterogeneously
dense, which may obscure small masses.
FINDINGS: There are no findings suspicious for malignancy. Images were
processed with CAD.
IMPRESSION: No mammographic evidence of malignancy. A result letter of this
screening mammogram will be mailed directly to the patient.

RECOMMENDATION:
Screening mammogram in one year. (Code:[id number]-[id number])

BI-RADS CATEGORY  1: Negative.

## 2021-05-05 ENCOUNTER — Other Ambulatory Visit: Payer: Self-pay

## 2021-05-05 ENCOUNTER — Telehealth: Payer: Self-pay | Admitting: Cardiology

## 2021-05-05 DIAGNOSIS — R002 Palpitations: Secondary | ICD-10-CM

## 2021-05-05 MED ORDER — METOPROLOL SUCCINATE ER 25 MG PO TB24: 25.0000 mg | ORAL_TABLET | Freq: Every day | ORAL | 0 refills | Status: DC

## 2021-05-05 NOTE — Telephone Encounter (Signed)
?*  STAT* If patient is at the pharmacy, call can be transferred to refill team. ? ? ?1. Which medications need to be refilled? (please list name of each medication and dose if known) metoprolol succinate (TOPROL-XL) 25 MG 24 hr tablet ? ?2. Which pharmacy/location (including street and city if local pharmacy) is medication to be sent to? [id number] [address] [place]? ?3. Do they need a 30 day or 90 day supply? 90 day ? ? ?Patient has an appointment 8/3 ?

## 2021-05-05 NOTE — Telephone Encounter (Signed)
Medication sent.

## 2021-08-05 ENCOUNTER — Other Ambulatory Visit: Payer: Self-pay | Admitting: Cardiology

## 2021-08-05 DIAGNOSIS — R002 Palpitations: Secondary | ICD-10-CM

## 2021-08-16 NOTE — Progress Notes (Signed)
HPI: FU palpitations. Monitor March 2017 showed sinus with occasional PACs, brief PAT, rare PVC and isolated couplet. Echocardiogram March 2017 showed vigorous LV systolic function. Exercise treadmill November 2017 negative. Since last seen she lost her husband in an accident in January and is appropriately grieving.  She denies dyspnea, chest pain, palpitations or syncope.  Current Outpatient Medications  Medication Sig Dispense Refill   metoprolol succinate (TOPROL-XL) 25 MG 24 hr tablet TAKE 1 TABLET (25 MG TOTAL) BY MOUTH DAILY. 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   GERD (gastroesophageal reflux disease)    diet controlled - no meds   Murmur    as a child - no problems as an adult   Palpitations    tx w/metoprolol   Urine incontinence    UTI (lower urinary tract infection)     Past Surgical History:  Procedure Laterality Date   bladder stretch     x 2   CESAREAN SECTION  1981   x 1   COLONOSCOPY     polyps/Gupta   TUBAL LIGATION  2006    Social History   Socioeconomic History   Marital status: Married    Spouse name: [name]   Number of children: 1   Years of education: 12   Highest education level: Not on file  Occupational History   Occupation: Therapist, art    Comment: [employer]  Tobacco Use   Smoking status: Never   Smokeless tobacco: Never  Vaping Use   Vaping Use: Never used  Substance and Sexual Activity   Alcohol use: No   Drug use: No   Sexual activity: Yes    Birth control/protection: Post-menopausal  Other Topics Concern   Not on file  Social History [place] is currently living in [place] with her husband, [name], of 15 years. She has  1 daughter from a previous marriage. They have a dog. She works in Therapist, art for a Psychiatrist. She enjoys shopping and spending time with her grandchildren. She has 2 grandchildren ([name] age 89 and [name] age 10). She also has a step grandchild, [name], age 21.   Social Determinants of Health   Financial Resource Strain: Not on file  Food Insecurity: Not on file  Transportation Needs: Not on file  Physical Activity: Not on file  Stress: Not on file  Social Connections: Not on file  Intimate Partner Violence: Not on file    Family History  Problem Relation Age of Onset   Arthritis Mother    Hyperlipidemia Mother    Hypertension Mother    CAD Mother    Pancreatic cancer Mother    Cancer Maternal Grandmother 81       Breast and unsure uterine/ovary?   Breast cancer Maternal Grandmother    Cancer Paternal Aunt 60       breast   Breast cancer Paternal Aunt    Colon cancer Neg Hx    Rectal cancer Neg Hx    Stomach cancer Neg Hx    Esophageal cancer Neg Hx     ROS: no fevers or chills, productive cough, hemoptysis,  dysphasia, odynophagia, melena, hematochezia, dysuria, hematuria, rash, seizure activity, orthopnea, PND, pedal edema, claudication. Remaining systems are negative.  Physical Exam: Well-developed well-nourished in no acute distress.  Skin is warm and dry.  HEENT is normal.  Neck is supple.  Chest is clear to auscultation with normal expansion.  Cardiovascular exam is regular rate and rhythm.  Abdominal exam nontender or distended. No masses palpated. Extremities show no edema. neuro grossly intact  ECG-normal sinus rhythm at a rate of 72, no ST changes.  Personally reviewed  A/P  1 palpitations-symptoms are well controlled.  We will continue beta-blocker at present dose.  2 history of chest pain-previous exercise treadmill negative.  Kirk Ruths, MD

## 2021-08-26 ENCOUNTER — Ambulatory Visit: Payer: BC Managed Care – PPO | Admitting: Cardiology

## 2021-08-26 ENCOUNTER — Encounter: Payer: Self-pay | Admitting: Cardiology

## 2021-08-26 VITALS — BP 124/76 | HR 72 | Ht 62.0 in | Wt 169.6 lb

## 2021-08-26 DIAGNOSIS — R002 Palpitations: Secondary | ICD-10-CM

## 2021-08-26 NOTE — Patient Instructions (Signed)

## 2021-09-13 ENCOUNTER — Encounter: Payer: Self-pay | Admitting: Physician Assistant

## 2021-09-13 ENCOUNTER — Ambulatory Visit (INDEPENDENT_AMBULATORY_CARE_PROVIDER_SITE_OTHER): Payer: BC Managed Care – PPO | Admitting: Physician Assistant

## 2021-09-13 VITALS — BP 121/76 | HR 94 | Temp 97.7°F | Ht 61.0 in | Wt 170.0 lb

## 2021-09-13 DIAGNOSIS — Z7689 Persons encountering health services in other specified circumstances: Secondary | ICD-10-CM | POA: Diagnosis not present

## 2021-09-13 DIAGNOSIS — R002 Palpitations: Secondary | ICD-10-CM | POA: Diagnosis not present

## 2021-09-13 DIAGNOSIS — F4321 Adjustment disorder with depressed mood: Secondary | ICD-10-CM

## 2021-09-13 DIAGNOSIS — F4323 Adjustment disorder with mixed anxiety and depressed mood: Secondary | ICD-10-CM | POA: Diagnosis not present

## 2021-09-13 DIAGNOSIS — G8929 Other chronic pain: Secondary | ICD-10-CM | POA: Insufficient documentation

## 2021-09-13 DIAGNOSIS — R5383 Other fatigue: Secondary | ICD-10-CM | POA: Insufficient documentation

## 2021-09-13 MED ORDER — SERTRALINE HCL 25 MG PO TABS: 25.0000 mg | ORAL_TABLET | Freq: Every day | ORAL | 1 refills | Status: DC

## 2021-09-13 NOTE — Patient Instructions (Signed)
Mallory [place], MD: [facility]  Managing Loss, Adult People experience loss in many different ways throughout their lives. Events such as moving, changing jobs, and losing friends can create a sense of loss. The loss may be as serious as a major health change, divorce, death of a pet, or death of a loved one. All of these types of loss are likely to create a physical and emotional reaction known as grief. Grief is the result of a major change or an absence of something or someone that you count on. Grief is a normal reaction to loss. A variety of factors can affect your grieving experience, including: The nature of your loss. Your relationship to what or whom you lost. Your understanding of grief and how to manage it. Your support system. Be aware that when grief becomes extreme, it can lead to more severe issues like isolation, depression, anxiety, or suicidal thoughts. Talk with your health care provider if you have any of these issues. How to manage lifestyle changes Keep to your normal routine as much as possible. If you have trouble focusing or doing normal activities, it is acceptable to take some time away from your normal routine. Spend time with friends and loved ones. Eat a healthy diet, get plenty of sleep, and rest when you feel tired. How to recognize changes  The way that you deal with your grief will affect your ability to function as you normally do. When grieving, you may experience these changes: Numbness, shock, sadness, anxiety, anger, denial, and guilt. Thoughts about death. Unexpected crying. A physical sensation of emptiness in your stomach. Problems sleeping and eating. Tiredness (fatigue). Loss of interest in normal activities. Dreaming about or imagining seeing the person who died. A need to remember what or whom you lost. Difficulty thinking about anything other than your loss for a period of time. Relief. If you have been expecting the loss for a while, you  may feel a sense of relief when it happens. Follow these instructions at home: Activity Express your feelings in healthy ways, such as: Talking with others about your loss. It may be helpful to find others who have had a similar loss, such as a support group. Writing down your feelings in a journal. Doing physical activities to release stress and emotional energy. Doing creative activities like painting, sculpting, or playing or listening to music. Practicing resilience. This is the ability to recover and adjust after facing challenges. Reading some resources that encourage resilience may help you to learn ways to practice those behaviors.  General instructions Be patient with yourself and others. Allow the grieving process to happen, and remember that grieving takes time. It is likely that you may never feel completely done with some grief. You may find a way to move on while still cherishing memories and feelings about your loss. Accepting your loss is a process. It can take months or longer to adjust. Keep all follow-up visits. This is important. Where to find support To get support for managing loss: Ask your health care provider for help and recommendations, such as grief counseling or therapy. Think about joining a support group for people who are managing a loss. Where to find more information You can find more information about managing loss from: American Society of Clinical Oncology: www.cancer.net American Psychological Association: TVStere[employer].ch Contact a health care provider if: Your grief is extreme and keeps getting worse. You have ongoing grief that does not improve. Your body shows symptoms of grief, such as  illness. You feel depressed, anxious, or hopeless. Get help right away if: You have thoughts about hurting yourself or others. Get help right away if you feel like you may hurt yourself or others, or have thoughts about taking your own life. Go to your nearest  emergency room or: Call 911. Call the [place] at 859-799-8483 or 988. This is open 24 hours a day. Text the Crisis Text Line at 417-568-9295. Summary Grief is the result of a major change or an absence of someone or something that you count on. Grief is a normal reaction to loss. The depth of grief and the period of recovery depend on the type of loss and your ability to adjust to the change and process your feelings. Processing grief requires patience and a willingness to accept your feelings and talk about your loss with people who are supportive. It is important to find resources that work for you and to realize that people experience grief differently. There is not one grieving process that works for everyone in the same way. Be aware that when grief becomes extreme, it can lead to more severe issues like isolation, depression, anxiety, or suicidal thoughts. Talk with your health care provider if you have any of these issues. This information is not intended to replace advice given to you by your health care provider. Make sure you discuss any questions you have with your health care provider. Document Revised: 08/31/2020 Document Reviewed: 08/31/2020 Elsevier Patient Education  [place].

## 2021-09-13 NOTE — Progress Notes (Signed)
New Patient Office Visit  Subjective    Patient ID: Mallory Sanchez, female    DOB: Jan 06, 1961  Age: 61 y.o. MRN: [id number]  CC:  Chief Complaint  Patient presents with   New Patient (Initial Visit)    HPI [name] presents to establish care. Patient has a past medical history of palpitations and takes metoprolol succinate 25 mg. Is followed by cardiology. Patient reports lost her husband January of this year and has been having a challenging time. Does have a good support system. No SI/HI. No new surgeries. No new family history. Patient has never been a smoker.      09/13/2021    2:29 PM  Depression screen PHQ 2/9  Decreased Interest 1  Down, Depressed, Hopeless 1  PHQ - 2 Score 2  Altered sleeping 1  Tired, decreased energy 1  Change in appetite 1  Feeling bad or failure about yourself  1  Trouble concentrating 1  Moving slowly or fidgety/restless 0  Suicidal thoughts 0  PHQ-9 Score 7  Difficult doing work/chores Somewhat difficult      09/13/2021    2:29 PM  GAD 7 : Generalized Anxiety Score  Nervous, Anxious, on Edge 1  Control/stop worrying 1  Worry too much - different things 1  Trouble relaxing 1  Restless 1  Easily annoyed or irritable 1  Afraid - awful might happen 0  Total GAD 7 Score 6  Anxiety Difficulty Somewhat difficult        Outpatient Encounter Medications as of 09/13/2021  Medication Sig   metoprolol succinate (TOPROL-XL) 25 MG 24 hr tablet TAKE 1 TABLET (25 MG TOTAL) BY MOUTH DAILY.   sertraline (ZOLOFT) 25 MG tablet Take 1 tablet (25 mg total) by mouth daily.   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   GERD (gastroesophageal reflux disease)    diet controlled - no meds   Murmur    as a child - no problems as an adult   Palpitations    tx  w/metoprolol   Urine incontinence    UTI (lower urinary tract infection)     Past Surgical History:  Procedure Laterality Date   bladder stretch     x 2   CESAREAN SECTION  1981   x 1   COLONOSCOPY     polyps/Gupta   TUBAL LIGATION  2006    Family History  Problem Relation Age of Onset   Arthritis Mother    Hyperlipidemia Mother    Hypertension Mother    CAD Mother    Pancreatic cancer Mother    Cancer Maternal Grandmother 32       Breast and unsure uterine/ovary?   Breast cancer Maternal Grandmother    Cancer Paternal Aunt 21       breast   Breast cancer Paternal Aunt    Colon cancer Neg Hx    Rectal cancer Neg Hx    Stomach cancer Neg Hx    Esophageal cancer Neg Hx     Social History   Socioeconomic History   Marital status: Married    Spouse name: [name]   Number of children: 1   Years of education: 12   Highest education level: Not on file  Occupational History   Occupation: Therapist, art  Comment: [employer]  Tobacco Use   Smoking status: Never   Smokeless tobacco: Never  Vaping Use   Vaping Use: Never used  Substance and Sexual Activity   Alcohol use: No   Drug use: No   Sexual activity: Yes    Birth control/protection: Post-menopausal  Other Topics Concern   Not on file  Social History Narrative   [place] is currently living in [place] with her husband, [name], of 15 years. She has 1 daughter from a previous marriage. They have a dog. She works in Therapist, art for a Psychiatrist. She enjoys shopping and spending time with her grandchildren. She has 2 grandchildren ([name] age 23 and [name] age 50). She also has a step grandchild, [name], age 70.   Social Determinants of Health   Financial Resource Strain: Not on file  Food Insecurity: Not on file  Transportation Needs: Not on file  Physical Activity: Not on file  Stress: Not on file  Social Connections: Not on file  Intimate Partner Violence: Not on file    ROS Review  of Systems:  A fourteen system review of systems was performed and found to be positive as per HPI.      Objective    BP 121/76   Pulse 94   Temp 97.7 F (36.5 C)   Ht '5\' 1"'$  (1.549 m)   Wt 170 lb (77.1 kg)   SpO2 95%   BMI 32.12 kg/m   Physical Exam General: Pleasant and cooperative, appropriate for stated age.  Neuro:  Alert and oriented,  extra-ocular muscles intact  HEENT:  Normocephalic, atraumatic, neck supple  Skin:  no gross rash, warm, pink. Cardiac:  RRR, S1 S2 Respiratory: CTA B/L  Vascular:  Ext warm, no cyanosis apprec.; cap RF less 2 sec. Psych:  No HI/SI, judgement and insight good, Emotional mood. Full Affect.      Assessment & Plan:   Problem List Items Addressed This Visit       Other   Palpitations - Primary   Other Visit Diagnoses     Grief       Adjustment disorder with mixed anxiety and depressed mood       Relevant Medications   sertraline (ZOLOFT) 25 MG tablet   Encounter to establish care          Palpitations: -Followed by cardiology. Reviewed recent cardiology visit. On beta blocker therapy. Symptoms controlled with medication.  Adjustment disorder with mixed anxiety and depressed mood, Grief: -PHQ-9 score of 7 and GAD-7 score of 6. Discussed with patient management options and wants to trial medication therapy. Will start SSRI therapy with sertraline 25 mg daily. Discussed potential side effects and advised patient to let me know if unable to tolerate medication. Pt verbalized understanding. Will reassess mood and medication therapy in 6 weeks. Also recommend to consider grief counseling.   Return in about 6 weeks (around 10/25/2021) for Mood- started med.   Lorrene Reid, PA-C

## 2021-09-14 ENCOUNTER — Other Ambulatory Visit: Payer: Self-pay | Admitting: Obstetrics and Gynecology

## 2021-09-14 DIAGNOSIS — Z1231 Encounter for screening mammogram for malignant neoplasm of breast: Secondary | ICD-10-CM

## 2021-10-06 ENCOUNTER — Other Ambulatory Visit: Payer: Self-pay | Admitting: Physician Assistant

## 2021-10-06 DIAGNOSIS — F4323 Adjustment disorder with mixed anxiety and depressed mood: Secondary | ICD-10-CM

## 2021-10-07 ENCOUNTER — Ambulatory Visit
Admission: RE | Admit: 2021-10-07 | Discharge: 2021-10-07 | Disposition: A | Discharge status: Home / Self Care | Payer: BC Managed Care – PPO | Source: Ambulatory Visit | Attending: Obstetrics and Gynecology | Admitting: Obstetrics and Gynecology

## 2021-10-07 DIAGNOSIS — Z1231 Encounter for screening mammogram for malignant neoplasm of breast: Secondary | ICD-10-CM

## 2021-10-25 ENCOUNTER — Ambulatory Visit (INDEPENDENT_AMBULATORY_CARE_PROVIDER_SITE_OTHER): Payer: BC Managed Care – PPO | Admitting: Physician Assistant

## 2021-10-25 ENCOUNTER — Encounter: Payer: Self-pay | Admitting: Physician Assistant

## 2021-10-25 VITALS — BP 131/79 | HR 74 | Temp 98.0°F | Wt 164.0 lb

## 2021-10-25 DIAGNOSIS — F4323 Adjustment disorder with mixed anxiety and depressed mood: Secondary | ICD-10-CM

## 2021-10-25 MED ORDER — SERTRALINE HCL 50 MG PO TABS: 50.0000 mg | ORAL_TABLET | Freq: Every day | ORAL | 0 refills | Status: DC

## 2021-10-25 NOTE — Progress Notes (Signed)
Established patient visit   Patient: Mallory Sanchez   DOB: 04/04/1960   61 y.o. Female  MRN: [id number] Visit Date: 10/25/2021  Chief Complaint  Patient presents with   Follow-up   Subjective    HPI  Patient presents for follow-up on mood. Reports not crying as much. Reports feeling more nervous and having trouble relaxing at night. Does reports worrying more about work. Work is currently stressful. Does have some intermittent diarrhea which is tolerable since starting sertraline 25 mg.     10/25/2021    4:08 PM 09/13/2021    2:29 PM  Depression screen PHQ 2/9  Decreased Interest 1 1  Down, Depressed, Hopeless 1 1  PHQ - 2 Score 2 2  Altered sleeping 1 1  Tired, decreased energy 0 1  Change in appetite 0 1  Feeling bad or failure about yourself  1 1  Trouble concentrating 1 1  Moving slowly or fidgety/restless 0 0  Suicidal thoughts 0 0  PHQ-9 Score 5 7  Difficult doing work/chores Not difficult at all Somewhat difficult      10/25/2021    4:08 PM 09/13/2021    2:29 PM  GAD 7 : Generalized Anxiety Score  Nervous, Anxious, on Edge 3 1  Control/stop worrying 1 1  Worry too much - different things 3 1  Trouble relaxing 3 1  Restless 1 1  Easily annoyed or irritable 0 1  Afraid - awful might happen 1 0  Total GAD 7 Score 12 6  Anxiety Difficulty Not difficult at all Somewhat difficult       Medications: Outpatient Medications Prior to Visit  Medication Sig   metoprolol succinate (TOPROL-XL) 25 MG 24 hr tablet TAKE 1 TABLET (25 MG TOTAL) BY MOUTH DAILY.   [DISCONTINUED] sertraline (ZOLOFT) 25 MG tablet TAKE 1 TABLET (25 MG TOTAL) BY MOUTH DAILY.   Facility-Administered Medications Prior to Visit  Medication Dose Route Frequency Provider   0.9 %  sodium chloride infusion  500 mL Intravenous Once Jackquline Denmark, MD    Review of Systems Review of Systems:  A fourteen system review of systems was performed and found to be positive as per HPI.  Last CBC Lab  Results  Component Value Date   WBC 7.7 11/16/2015   HGB 13.1 11/16/2015   HCT 38.4 11/16/2015   MCV 92.3 11/16/2015   MCH 31.5 11/16/2015   RDW 12.2 11/16/2015   PLT 274 97/98/9211   Last metabolic panel Lab Results  Component Value Date   GLUCOSE 98 11/16/2015   NA 142 11/16/2015   K 3.6 11/16/2015   CL 108 11/16/2015   CO2 25 11/16/2015   BUN 7 11/16/2015   CREATININE 0.70 11/16/2015   GFRNONAA >60 11/16/2015   CALCIUM 9.4 11/16/2015   PROT 7.0 10/05/2012   ALBUMIN 4.3 10/05/2012   BILITOT 1.0 10/05/2012   ALKPHOS 56 10/05/2012   AST 15 10/05/2012   ALT 18 10/05/2012   ANIONGAP 9 11/16/2015   Last lipids Lab Results  Component Value Date   CHOL 193 10/05/2012   HDL 52.80 10/05/2012   LDLCALC 121 (H) 10/05/2012   TRIG 95.0 10/05/2012   CHOLHDL 4 10/05/2012   Last hemoglobin A1c No results found for: "HGBA1C" Last thyroid functions Lab Results  Component Value Date   TSH 1.53 03/23/2015       Objective    BP 131/79   Pulse 74   Temp 98 F (36.7 C) (Temporal)   Wt 164 lb (  74.4 kg)   BMI 30.99 kg/m  BP Readings from Last 3 Encounters:  10/25/21 131/79  09/13/21 121/76  08/26/21 124/76   Wt Readings from Last 3 Encounters:  10/25/21 164 lb (74.4 kg)  09/13/21 170 lb (77.1 kg)  08/26/21 169 lb 9.6 oz (76.9 kg)    Physical Exam  General:  Well Developed, well nourished, appropriate for stated age.  Neuro:  Alert and oriented,  extra-ocular muscles intact  HEENT:  Normocephalic, atraumatic, neck supple  Skin:  no gross rash, warm, pink. Cardiac:  RRR, S1 S2 Respiratory: CTA B/L  Vascular:  Ext warm, no cyanosis apprec.; cap RF less 2 sec. Psych:  No HI/SI, judgement and insight good, Euthymic mood. Full Affect.   No results found for any visits on 10/25/21.  Assessment & Plan      Problem List Items Addressed This Visit   None Visit Diagnoses     Adjustment disorder with mixed anxiety and depressed mood    -  Primary   Relevant  Medications   sertraline (ZOLOFT) 50 MG tablet      Adjustment disorder with mixed anxiety and depressed mood: -PHQ-9 score has improved. GAD-7 score higher than previously. Will increase sertraline to 50 mg daily. Advised patient if anxiety worsens with increased dose to let me know and will change to another SSRI. Pt verbalized understanding. Recommend to follow-up in 4 weeks to reassess mood and medication therapy.  Return in about 4 weeks (around 11/22/2021) for Mood.        Lorrene Reid, PA-C  [facility] at [facility] [phone] (phone) [phone] (fax)  [place]

## 2021-10-25 NOTE — Patient Instructions (Signed)

## 2021-11-16 ENCOUNTER — Other Ambulatory Visit: Payer: Self-pay | Admitting: Physician Assistant

## 2021-11-16 DIAGNOSIS — F4323 Adjustment disorder with mixed anxiety and depressed mood: Secondary | ICD-10-CM

## 2021-12-02 ENCOUNTER — Ambulatory Visit: Payer: BC Managed Care – PPO | Admitting: Physician Assistant

## 2021-12-08 ENCOUNTER — Ambulatory Visit: Payer: BC Managed Care – PPO | Admitting: Physician Assistant

## 2021-12-08 ENCOUNTER — Encounter: Payer: Self-pay | Admitting: Physician Assistant

## 2021-12-08 VITALS — BP 106/72 | HR 73 | Resp 18 | Ht 61.0 in | Wt 162.0 lb

## 2021-12-08 DIAGNOSIS — F4323 Adjustment disorder with mixed anxiety and depressed mood: Secondary | ICD-10-CM

## 2021-12-08 DIAGNOSIS — F32A Depression, unspecified: Secondary | ICD-10-CM | POA: Insufficient documentation

## 2021-12-08 NOTE — Progress Notes (Signed)
Established patient visit   Patient: Mallory Sanchez   DOB: 02-02-1960   61 y.o. Female  MRN: [id number] Visit Date: 12/08/2021  Chief Complaint  Patient presents with   Follow-up    Non fasting   Depression   Anxiety   Subjective    HPI HPI     Follow-up    Additional comments: Non fasting      Last edited by Gemma Payor, CMA on 12/08/2021  9:46 AM.      Patient presents for follow-up on mood. Reports tolerating increased dose of sertraline. States anxiety has improved. Does not feel the chest tightness as before. Reports is planning to spend time with her family during holidays.     12/08/2021    9:48 AM 10/25/2021    4:08 PM 09/13/2021    2:29 PM  Depression screen PHQ 2/9  Decreased Interest '1 1 1  '$ Down, Depressed, Hopeless '1 1 1  '$ PHQ - 2 Score '2 2 2  '$ Altered sleeping '1 1 1  '$ Tired, decreased energy 1 0 1  Change in appetite 1 0 1  Feeling bad or failure about yourself  '1 1 1  '$ Trouble concentrating '1 1 1  '$ Moving slowly or fidgety/restless 0 0 0  Suicidal thoughts 0 0 0  PHQ-9 Score '7 5 7  '$ Difficult doing work/chores Not difficult at all Not difficult at all Somewhat difficult      12/08/2021    9:49 AM 10/25/2021    4:08 PM 09/13/2021    2:29 PM  GAD 7 : Generalized Anxiety Score  Nervous, Anxious, on Edge '1 3 1  '$ Control/stop worrying '1 1 1  '$ Worry too much - different things '1 3 1  '$ Trouble relaxing '1 3 1  '$ Restless '1 1 1  '$ Easily annoyed or irritable 1 0 1  Afraid - awful might happen 1 1 0  Total GAD 7 Score '7 12 6  '$ Anxiety Difficulty Not difficult at all Not difficult at all Somewhat difficult        Medications: Outpatient Medications Prior to Visit  Medication Sig   metoprolol succinate (TOPROL-XL) 25 MG 24 hr tablet TAKE 1 TABLET (25 MG TOTAL) BY MOUTH DAILY.   sertraline (ZOLOFT) 50 MG tablet TAKE 1 TABLET BY MOUTH EVERY DAY   triamcinolone cream (KENALOG) 0.5 % Apply 1 Application topically 2 (two) times daily.    Facility-Administered Medications Prior to Visit  Medication Dose Route Frequency Provider   0.9 %  sodium chloride infusion  500 mL Intravenous Once Jackquline Denmark, MD    Review of Systems Review of Systems:  A fourteen system review of systems was performed and found to be positive as per HPI.  Last CBC Lab Results  Component Value Date   WBC 7.7 11/16/2015   HGB 13.1 11/16/2015   HCT 38.4 11/16/2015   MCV 92.3 11/16/2015   MCH 31.5 11/16/2015   RDW 12.2 11/16/2015   PLT 274 34/74/2595   Last metabolic panel Lab Results  Component Value Date   GLUCOSE 98 11/16/2015   NA 142 11/16/2015   K 3.6 11/16/2015   CL 108 11/16/2015   CO2 25 11/16/2015   BUN 7 11/16/2015   CREATININE 0.70 11/16/2015   GFRNONAA >60 11/16/2015   CALCIUM 9.4 11/16/2015   PROT 7.0 10/05/2012   ALBUMIN 4.3 10/05/2012   BILITOT 1.0 10/05/2012   ALKPHOS 56 10/05/2012   AST 15 10/05/2012   ALT 18 10/05/2012   ANIONGAP 9 11/16/2015  Last lipids Lab Results  Component Value Date   CHOL 193 10/05/2012   HDL 52.80 10/05/2012   LDLCALC 121 (H) 10/05/2012   TRIG 95.0 10/05/2012   CHOLHDL 4 10/05/2012   Last hemoglobin A1c No results found for: "HGBA1C" Last thyroid functions Lab Results  Component Value Date   TSH 1.53 03/23/2015       Objective    BP 106/72 (BP Location: Left Arm, Patient Position: Sitting, Cuff Size: Normal)   Pulse 73   Resp 18   Ht '5\' 1"'$  (1.549 m)   Wt 162 lb (73.5 kg)   SpO2 98%   BMI 30.61 kg/m  BP Readings from Last 3 Encounters:  12/08/21 106/72  10/25/21 131/79  09/13/21 121/76   Wt Readings from Last 3 Encounters:  12/08/21 162 lb (73.5 kg)  10/25/21 164 lb (74.4 kg)  09/13/21 170 lb (77.1 kg)    Physical Exam  General:  Pleasant and cooperative, appropriate for stated age.  Neuro:  Alert and oriented,  extra-ocular muscles intact  HEENT:  Normocephalic, atraumatic, neck supple  Skin:  no gross rash, warm, pink. Cardiac:  RRR, S1  S2 Respiratory: CTA B/L  Vascular:  Ext warm, no cyanosis apprec.; cap RF less 2 sec. Psych:  No HI/SI, judgement and insight good, Euthymic mood. Full Affect.   No results found for any visits on 12/08/21.  Assessment & Plan      Problem List Items Addressed This Visit       Other   Adjustment disorder with mixed anxiety and depressed mood - Primary    -Improved. Recommend to continue sertraline 50 mg daily. Discussed non-pharmacologic therapy.         Return in about 3 months (around 03/10/2022) for CPE and FBW.        Lorrene Reid, PA-C  [facility] at [facility] [phone] (phone) [phone] (fax)  [place]

## 2021-12-08 NOTE — Assessment & Plan Note (Signed)
-  Improved. Recommend to continue sertraline 50 mg daily. Discussed non-pharmacologic therapy.

## 2021-12-08 NOTE — Patient Instructions (Signed)

## 2021-12-10 ENCOUNTER — Other Ambulatory Visit: Payer: Self-pay | Admitting: Cardiology

## 2021-12-10 DIAGNOSIS — R002 Palpitations: Secondary | ICD-10-CM

## 2022-02-14 ENCOUNTER — Other Ambulatory Visit: Payer: Self-pay | Admitting: Nurse Practitioner

## 2022-02-14 DIAGNOSIS — Z0000 Encounter for general adult medical examination without abnormal findings: Secondary | ICD-10-CM

## 2022-03-03 ENCOUNTER — Other Ambulatory Visit: Payer: BC Managed Care – PPO

## 2022-03-03 DIAGNOSIS — Z0000 Encounter for general adult medical examination without abnormal findings: Secondary | ICD-10-CM

## 2022-03-04 LAB — LIPID PANEL
Chol/HDL Ratio: 4.2 ratio (ref 0.0–4.4)
Cholesterol, Total: 207 mg/dL — ABNORMAL HIGH (ref 100–199)
HDL: 49 mg/dL (ref >39)
LDL Chol Calc (NIH): 128 mg/dL — ABNORMAL HIGH (ref 0–99)
Triglycerides: 167 mg/dL — ABNORMAL HIGH (ref 0–149)
VLDL Cholesterol Cal: 30 mg/dL (ref 5–40)

## 2022-03-04 LAB — TSH: TSH: 1.82 u[IU]/mL (ref 0.450–4.500)

## 2022-03-04 LAB — COMPREHENSIVE METABOLIC PANEL
ALT: 17 IU/L (ref 0–32)
AST: 15 IU/L (ref 0–40)
Albumin/Globulin Ratio: 1.9 (ref 1.2–2.2)
Albumin: 4.5 g/dL (ref 3.8–4.9)
Alkaline Phosphatase: 76 IU/L (ref 44–121)
BUN/Creatinine Ratio: 14 (ref 12–28)
BUN: 10 mg/dL (ref 8–27)
Bilirubin Total: 0.7 mg/dL (ref 0.0–1.2)
CO2: 23 mmol/L (ref 20–29)
Calcium: 9.6 mg/dL (ref 8.7–10.3)
Chloride: 105 mmol/L (ref 96–106)
Creatinine, Ser: 0.73 mg/dL (ref 0.57–1.00)
Globulin, Total: 2.4 g/dL (ref 1.5–4.5)
Glucose: 101 mg/dL — ABNORMAL HIGH (ref 70–99)
Potassium: 4.8 mmol/L (ref 3.5–5.2)
Sodium: 143 mmol/L (ref 134–144)
Total Protein: 6.9 g/dL (ref 6.0–8.5)
eGFR: 94 mL/min/{1.73_m2} (ref >59)

## 2022-03-04 LAB — CBC WITH DIFFERENTIAL/PLATELET
Basophils Absolute: 0 10*3/uL (ref 0.0–0.2)
Basos: 1 %
EOS (ABSOLUTE): 0.1 10*3/uL (ref 0.0–0.4)
Eos: 2 %
Hematocrit: 40.5 % (ref 34.0–46.6)
Hemoglobin: 13.8 g/dL (ref 11.1–15.9)
Immature Grans (Abs): 0 10*3/uL (ref 0.0–0.1)
Immature Granulocytes: 0 %
Lymphocytes Absolute: 2 10*3/uL (ref 0.7–3.1)
Lymphs: 28 %
MCH: 32.5 pg (ref 26.6–33.0)
MCHC: 34.1 g/dL (ref 31.5–35.7)
MCV: 96 fL (ref 79–97)
Monocytes Absolute: 0.5 10*3/uL (ref 0.1–0.9)
Monocytes: 8 %
Neutrophils Absolute: 4.3 10*3/uL (ref 1.4–7.0)
Neutrophils: 61 %
Platelets: 258 10*3/uL (ref 150–450)
RBC: 4.24 x10E6/uL (ref 3.77–5.28)
RDW: 12.5 % (ref 11.7–15.4)
WBC: 6.9 10*3/uL (ref 3.4–10.8)

## 2022-03-04 LAB — HEMOGLOBIN A1C
Est. average glucose Bld gHb Est-mCnc: 100 mg/dL
Hgb A1c MFr Bld: 5.1 % (ref 4.8–5.6)

## 2022-03-04 NOTE — Progress Notes (Signed)
Total cholesterol, LDL, and triglycerides are mildly elevated. Other labs good. Discuss at visit 03/10/2022.

## 2022-03-09 NOTE — Progress Notes (Signed)
   Complete physical exam  Patient: Mallory Sanchez   DOB: 12/09/1960   62 y.o. Female  MRN: [id number]  Subjective:    No chief complaint on file.   Mallory Sanchez is a 62 y.o. female who presents today for a complete physical exam. She reports consuming a {diet types:17450} diet. {types:19826} She generally feels {DESC; WELL/FAIRLY WELL/POORLY:18703}. She reports sleeping {DESC; WELL/FAIRLY WELL/POORLY:18703}. She {does/does not:200015} have additional problems to discuss today.    Most recent fall risk assessment:    12/08/2021    9:49 AM  [place] in the past year? 0  Number falls in past yr: 0  Injury with Fall? 0     Most recent depression screenings:    12/08/2021    9:48 AM 10/25/2021    4:08 PM  PHQ 2/9 Scores  PHQ - 2 Score 2 2  PHQ- 9 Score 7 5    {VISON DENTAL STD PSA (Optional):27386}  {History (Optional):23778}  Patient Care Team: Lorrene Reid, PA-C as PCP - General (Physician Assistant)   Outpatient Medications Prior to Visit  Medication Sig   metoprolol succinate (TOPROL-XL) 25 MG 24 hr tablet TAKE 1 TABLET (25 MG TOTAL) BY MOUTH DAILY.   sertraline (ZOLOFT) 50 MG tablet TAKE 1 TABLET BY MOUTH EVERY DAY   triamcinolone cream (KENALOG) 0.5 % Apply 1 Application topically 2 (two) times daily.   Facility-Administered Medications Prior to Visit  Medication Dose Route Frequency Provider   0.9 %  sodium chloride infusion  500 mL Intravenous Once Jackquline Denmark, MD    ROS        Objective:     There were no vitals taken for this visit. {Vitals History (Optional):23777}  Physical Exam   No results found for any visits on 03/10/22. {Show previous labs (optional):23779}    Assessment & Plan:    Routine Health Maintenance and Physical Exam  Immunization History  Administered Date(s) Administered   Influenza-Unspecified 11/08/2012, 10/24/2016   Tdap 04/18/2014   Unspecified SARS-COV-2 Vaccination 06/28/2019, 07/19/2019     Health Maintenance  Topic Date Due   HIV Screening  Never done   Hepatitis C Screening  Never done   Zoster Vaccines- Shingrix (1 of 2) Never done   PAP SMEAR-Modifier  09/20/2021   COVID-19 Vaccine (3 - 2023-24 season) 09/24/2021   INFLUENZA VACCINE  04/24/2022 (Originally 08/24/2021)   MAMMOGRAM  10/08/2023   DTaP/Tdap/Td (2 - Td or Tdap) 04/17/2024   COLONOSCOPY (Pts 45-3yr Insurance coverage will need to be confirmed)  01/16/2028   HPV VACCINES  Aged Out    Discussed health benefits of physical activity, and encouraged her to engage in regular exercise appropriate for her age and condition.  Problem List Items Addressed This Visit   None  No follow-ups on file.     MVelva Harman PA

## 2022-03-10 ENCOUNTER — Other Ambulatory Visit (HOSPITAL_COMMUNITY)
Admission: RE | Admit: 2022-03-10 | Discharge: 2022-03-10 | Disposition: A | Discharge status: Home / Self Care | Payer: BC Managed Care – PPO | Source: Ambulatory Visit | Attending: Family Medicine | Admitting: Family Medicine

## 2022-03-10 ENCOUNTER — Encounter: Payer: Self-pay | Admitting: Family Medicine

## 2022-03-10 ENCOUNTER — Ambulatory Visit (INDEPENDENT_AMBULATORY_CARE_PROVIDER_SITE_OTHER): Payer: BC Managed Care – PPO | Admitting: Family Medicine

## 2022-03-10 VITALS — BP 135/80 | HR 68 | Resp 18 | Ht 61.0 in | Wt 157.0 lb

## 2022-03-10 DIAGNOSIS — Z0000 Encounter for general adult medical examination without abnormal findings: Secondary | ICD-10-CM

## 2022-03-10 DIAGNOSIS — R002 Palpitations: Secondary | ICD-10-CM

## 2022-03-10 DIAGNOSIS — F4323 Adjustment disorder with mixed anxiety and depressed mood: Secondary | ICD-10-CM | POA: Diagnosis not present

## 2022-03-10 DIAGNOSIS — Z124 Encounter for screening for malignant neoplasm of cervix: Secondary | ICD-10-CM

## 2022-03-10 NOTE — Assessment & Plan Note (Addendum)
Continue sertraline 50 mg daily.  Will follow-up in 3 to 4 months to see how things are going.  Over the rest of the year we will continue to check in to see if any adjustments need to be made to medication or if adding nonpharmacological options such as therapy would be beneficial as she would eventually like to come off of the medication.

## 2022-03-10 NOTE — Assessment & Plan Note (Addendum)
Followed by cardiology annually. Reviewed recent cardiology visit. On beta blocker therapy. Symptoms controlled with medication.

## 2022-03-14 LAB — CYTOLOGY - PAP: Diagnosis: NEGATIVE

## 2022-03-14 NOTE — Progress Notes (Signed)
Patient does not use MyChart and would prefer a phone call with her Pap smear results whenever one of you gets the chance.  Adequacy: Satisfactory for evaluation. The presence or absence of an endocervical/transformation zone component cannot be determined because of atrophy. Diagnosis: Negative for intraepithelial lesion or malignancy (NILM)  Her Pap smear was negative for any lesions or malignancy which is great news!  We will repeat Pap in 3 years.

## 2022-03-17 ENCOUNTER — Telehealth: Payer: Self-pay | Admitting: *Deleted

## 2022-03-17 ENCOUNTER — Telehealth: Payer: Self-pay

## 2022-03-17 DIAGNOSIS — R0982 Postnasal drip: Secondary | ICD-10-CM

## 2022-03-17 MED ORDER — FLUTICASONE PROPIONATE 50 MCG/ACT NA SUSP: 2.0000 | Freq: Every day | NASAL | 6 refills | Status: DC

## 2022-03-17 NOTE — Telephone Encounter (Signed)
I advised the pt of her result note stating Her Pap smear was negative for any lesions or malignancy which is great news!  We will repeat Pap in 3 years.   Pt was pleased with results

## 2022-03-17 NOTE — Telephone Encounter (Signed)
Pt calling stating that she saw provider last week and mentioned the drainage she was having.  She said provider told her that if it did not get better to call and she would send in something for her.

## 2022-03-18 NOTE — Telephone Encounter (Signed)
Pt informed of below.[address] [place], CMA    Velva Harman, [place]  You15 hours ago (5:08 PM)    Sent in [place] for her to try for her nasal drainage. She could also try OTC Sudafed PE if it is not helpful for her.

## 2022-04-26 ENCOUNTER — Other Ambulatory Visit: Payer: Self-pay | Admitting: Cardiology

## 2022-04-26 DIAGNOSIS — R002 Palpitations: Secondary | ICD-10-CM

## 2022-06-08 ENCOUNTER — Ambulatory Visit: Payer: BC Managed Care – PPO | Admitting: Family Medicine

## 2022-06-08 ENCOUNTER — Encounter: Payer: Self-pay | Admitting: Family Medicine

## 2022-06-08 VITALS — BP 109/71 | HR 61 | Resp 18 | Ht 61.0 in | Wt 161.0 lb

## 2022-06-08 DIAGNOSIS — L293 Anogenital pruritus, unspecified: Secondary | ICD-10-CM | POA: Diagnosis not present

## 2022-06-08 DIAGNOSIS — F4323 Adjustment disorder with mixed anxiety and depressed mood: Secondary | ICD-10-CM | POA: Diagnosis not present

## 2022-06-08 MED ORDER — SERTRALINE HCL 100 MG PO TABS: 100.0000 mg | ORAL_TABLET | Freq: Every day | ORAL | 1 refills | Status: DC

## 2022-06-08 MED ORDER — TRIAMCINOLONE ACETONIDE 0.5 % EX CREA: 1.0000 | TOPICAL_CREAM | Freq: Two times a day (BID) | CUTANEOUS | 3 refills | Status: DC

## 2022-06-08 NOTE — Assessment & Plan Note (Signed)
Patient previously got triamcinolone 0.5% cream prescription from her OB/GYN.  She occasionally gets genital pruritus and will use the cream for 1 day on the itchy patch.  This happens very infrequently, but she does keep it on hand and needs a refill.

## 2022-06-08 NOTE — Patient Instructions (Signed)
Please let me know if to resource from your work is not a good fit and I would be happy to send a referral in for you.  Take care of yourself until I see you again.  Let me know if you need anything in the meantime.

## 2022-06-08 NOTE — Progress Notes (Signed)
Established Patient Office Visit  Subjective   Patient ID: Mallory Sanchez, female    DOB: 1960/12/24  Age: 62 y.o. MRN: [id number]  Chief Complaint  Patient presents with   Anxiety   Depression    HPI [name] is a 62 y.o. female presenting today for follow up of mood. Mood: Patient is here to follow up for depression and grief.  She lost her husband in a tragic accident in January 2023 and has been struggling to adjust since that time.  Currently managing with Zoloft 50 mg daily. Taking medication without side effects, reports excellent compliance with treatment.  She feels she has seen some improvements in her mood since last visit, she is crying less often.  It is still very difficult for her to cope.  She endorses anhedonia, depressed mood, insomnia, fatigue, feelings of worthlessness/guilt feeling fidgety.  She denies difficulty concentrating, impaired memory, recurrent thoughts of death. Denies SI/HI.     06/15/2022    8:44 AM 03/10/2022    3:53 PM 12/08/2021    9:48 AM  Depression screen PHQ 2/9  Decreased Interest 2 1 1   Down, Depressed, Hopeless 2 1 1   PHQ - 2 Score 4 2 2   Altered sleeping 2 0 1  Tired, decreased energy 1 0 1  Change in appetite 0 0 1  Feeling bad or failure about yourself  1 0 1  Trouble concentrating 1 1 1   Moving slowly or fidgety/restless 2 0 0  Suicidal thoughts 0 0 0  PHQ-9 Score 11 3 7   Difficult doing work/chores Not difficult at all Not difficult at all Not difficult at all       06/15/2022    8:44 AM 03/10/2022    3:54 PM 12/08/2021    9:49 AM 10/25/2021    4:08 PM  GAD 7 : Generalized Anxiety Score  Nervous, Anxious, on Edge 3 1 1 3   Control/stop worrying 3 1 1 1   Worry too much - different things 2 1 1 3   Trouble relaxing 2 1 1 3   Restless 2 0 1 1  Easily annoyed or irritable 1 0 1 0  Afraid - awful might happen 2 1 1 1   Total GAD 7 Score 15 5 7 12   Anxiety Difficulty Not difficult at all Not difficult at all Not difficult  at all Not difficult at all   ROS Negative unless otherwise noted in HPI   Objective:     BP 109/71 (BP Location: Left Arm, Patient Position: Sitting, Cuff Size: Normal)   Pulse 61   Resp 18   Ht 5\' 1"  (1.549 m)   Wt 161 lb (73 kg)   SpO2 99%   BMI 30.42 kg/m   Physical Exam Constitutional:      Comments: Alert and oriented x 3.  HENT:     Head: Normocephalic and atraumatic.  Pulmonary:     Effort: Pulmonary effort is normal. No respiratory distress.  Musculoskeletal:     Cervical back: Normal range of motion.  Neurological:     General: No focal deficit present.     Mental Status: She is oriented to person, place, and time. Mental status is at baseline.  Psychiatric:        Attention and Perception: Attention and perception normal.        Mood and Affect: Mood normal. Mood is not depressed. Affect is tearful.        Speech: Speech normal.  Behavior: Behavior is not agitated, slowed or withdrawn. Behavior is cooperative.        Thought Content: Thought content normal. Thought content is not delusional. Thought content does not include homicidal or suicidal ideation. Thought content does not include homicidal or suicidal plan.        Cognition and Memory: Cognition and memory normal.        Judgment: Judgment normal. Judgment is not impulsive or inappropriate.     Assessment & Plan:  Adjustment disorder with mixed anxiety and depressed mood Assessment & Plan: PHQ-9 score increased to 11, GAD-7 score increased to 15.  Patient was tearful and cried during the majority of our office visit.  We discussed options to help her to further adjust, particularly since she is still finding that she is crying often and is not interested in doing things that she used to enjoy.  We discussed increasing Zoloft to 100 mg daily and/or connecting with a therapist or counselor.  She has a good support system of friends and family, but would benefit with help from someone who is trained in  helping with adjustment.  Patient would like to increase to Zoloft to 100 mg daily.  She has a counseling resource that is available through her job, and she is planning on calling them to see if it is a good fit.  If it is not a good fit, she will send me a message and I can send a referral to a therapist.  Orders: -     Sertraline HCl; Take 1 tablet (100 mg total) by mouth daily.  Dispense: 90 tablet; Refill: 1  Genital pruritus Assessment & Plan: Patient previously got triamcinolone 0.5% cream prescription from her OB/GYN.  She occasionally gets genital pruritus and will use the cream for 1 day on the itchy patch.  This happens very infrequently, but she does keep it on hand and needs a refill.  Orders: -     Triamcinolone Acetonide; Apply 1 Application topically 2 (two) times daily.  Dispense: 30 g; Refill: 3    Return in about 6 weeks (around 07/20/2022) for follow-up for increase in Zoloft.   I spent 45 minutes on the day of the encounter to include pre-visit record review, face-to-face time with the patient, and post visit ordering of medications.  Spent extensive time with the patient comforting her through her tears as well as providing counseling and education for management options.  Melida Quitter, PA

## 2022-06-08 NOTE — Assessment & Plan Note (Addendum)
PHQ-9 score increased to 11, GAD-7 score increased to 15.  Patient was tearful and cried during the majority of our office visit.  We discussed options to help her to further adjust, particularly since she is still finding that she is crying often and is not interested in doing things that she used to enjoy.  We discussed increasing Zoloft to 100 mg daily and/or connecting with a therapist or counselor.  She has a good support system of friends and family, but would benefit with help from someone who is trained in helping with adjustment.  Patient would like to increase to Zoloft to 100 mg daily.  She has a counseling resource that is available through her job, and she is planning on calling them to see if it is a good fit.  If it is not a good fit, she will send me a message and I can send a referral to a therapist.

## 2022-07-20 ENCOUNTER — Encounter: Payer: Self-pay | Admitting: Family Medicine

## 2022-07-20 ENCOUNTER — Ambulatory Visit: Payer: BC Managed Care – PPO | Admitting: Family Medicine

## 2022-07-20 VITALS — BP 104/69 | HR 70 | Resp 18 | Ht 61.0 in | Wt 161.0 lb

## 2022-07-20 DIAGNOSIS — F4323 Adjustment disorder with mixed anxiety and depressed mood: Secondary | ICD-10-CM

## 2022-07-20 NOTE — Patient Instructions (Signed)
I am so glad to hear that you have been doing better over the last few weeks! I am proud of the progress you have made and hope that things continue to get easier. Please let me know if you need anything before I see you again in a few months. Enjoy the pool with your family during these hot summer days!

## 2022-07-20 NOTE — Assessment & Plan Note (Signed)
PHQ-9 score improved from 11 to 4, GAD-7 score improved from 15 to 9.  Mallory Sanchez is happy with where she is at right now and would like to continue with her current dose of Zoloft.  She did contact the counseling services available through her work, but it was a very involved process to even get established so she has not done so at this point.  She is aware that it is still an option at any point in time.  For now, continue Zoloft 50 mg daily.  Will continue to monitor.

## 2022-07-20 NOTE — Progress Notes (Signed)
Established Patient Office Visit  Subjective   Patient ID: Mallory Sanchez, female    DOB: May 15, 1960  Age: 62 y.o. MRN: [id number]  Chief Complaint  Patient presents with   Anxiety   Depression    HPI Mallory Sanchez is a 62 y.o. female presenting today for follow up of mood. Mood: Patient is here to follow up for depression and anxiety, currently managing with Zoloft which was increased to 100 mg daily at last visit.  She tried the increased dose for about a week, but it made her feel very groggy and "zombie like", so she went back to her 50 mg dose.  Taking medication without side effects, reports excellent compliance with treatment. Denies mood changes or SI/HI. She feels mood is improved since last visit.  She still has ups and downs in her mood, but overall it has been much easier to deal with the difficult days.  She has even started sleeping in her bedroom again last week.  She rearrange the furniture and has been able to sleep better.  She still wakes up, but she is able to get back to sleep without as many issues. Denies chest pain, difficulty concentrating, dizziness, fatigue, insomnia, irritability, palpitations, panic attacks, racing thoughts, SOB, sweating. Denies anhedonia, depressed mood, difficulty concentrating, fatigue, feelings of worthlessness/guilt, hopelessness, hypersomnia, impaired memory, insomnia, psychomotor agitation, psychomotor retardation, recurrent thoughts of death, weight changes.     August 17, 2022    8:14 AM 06/08/2022    8:44 AM 03/10/2022    3:53 PM  Depression screen PHQ 2/9  Decreased Interest 1 2 1   Down, Depressed, Hopeless 1 2 1   PHQ - 2 Score 2 4 2   Altered sleeping 1 2 0  Tired, decreased energy 1 1 0  Change in appetite 0 0 0  Feeling bad or failure about yourself  0 1 0  Trouble concentrating 0 1 1  Moving slowly or fidgety/restless 0 2 0  Suicidal thoughts 0 0 0  PHQ-9 Score 4 11 3   Difficult doing work/chores Not difficult at all Not  difficult at all Not difficult at all       08/17/2022    8:14 AM 06/08/2022    8:44 AM 03/10/2022    3:54 PM 12/08/2021    9:49 AM  GAD 7 : Generalized Anxiety Score  Nervous, Anxious, on Edge 1 3 1 1   Control/stop worrying 2 3 1 1   Worry too much - different things 2 2 1 1   Trouble relaxing 2 2 1 1   Restless 1 2 0 1  Easily annoyed or irritable 0 1 0 1  Afraid - awful might happen 1 2 1 1   Total GAD 7 Score 9 15 5 7   Anxiety Difficulty Not difficult at all Not difficult at all Not difficult at all Not difficult at all   ROS Negative unless otherwise noted in HPI   Objective:     BP 104/69 (BP Location: Left Arm, Patient Position: Sitting, Cuff Size: Normal)   Pulse 70   Resp 18   Ht 5\' 1"  (1.549 m)   Wt 161 lb (73 kg)   SpO2 95%   BMI 30.42 kg/m   Physical Exam Constitutional:      General: She is not in acute distress.    Appearance: Normal appearance.  HENT:     Head: Normocephalic and atraumatic.  Pulmonary:     Effort: Pulmonary effort is normal. No respiratory distress.  Musculoskeletal:  Cervical back: Normal range of motion.  Neurological:     General: No focal deficit present.     Mental Status: She is alert and oriented to person, place, and time. Mental status is at baseline.  Psychiatric:        Mood and Affect: Mood normal.        Thought Content: Thought content normal.        Judgment: Judgment normal.     Assessment & Plan:  Adjustment disorder with mixed anxiety and depressed mood Assessment & Plan: PHQ-9 score improved from 11 to 4, GAD-7 score improved from 15 to 9.  Mallory Sanchez is happy with where she is at right now and would like to continue with her current dose of Zoloft.  She did contact the counseling services available through her work, but it was a very involved process to even get established so she has not done so at this point.  She is aware that it is still an option at any point in time.  For now, continue Zoloft 50 mg daily.   Will continue to monitor.     Return in about 3 months (around 10/20/2022) for follow-up for mood.    Melida Quitter, PA

## 2022-09-13 ENCOUNTER — Other Ambulatory Visit: Payer: Self-pay | Admitting: Family Medicine

## 2022-09-13 DIAGNOSIS — F4323 Adjustment disorder with mixed anxiety and depressed mood: Secondary | ICD-10-CM

## 2022-09-14 NOTE — Progress Notes (Signed)
HPI: FU palpitations. Monitor March 2017 showed sinus with occasional PACs, brief PAT, rare PVC and isolated couplet. Echocardiogram March 2017 showed vigorous LV systolic function. Exercise treadmill November 2017 negative. Since last seen the patient denies any dyspnea on exertion, orthopnea, PND, pedal edema, palpitations, syncope or chest pain.    Current Outpatient Medications  Medication Sig Dispense Refill   metoprolol succinate (TOPROL-XL) 25 MG 24 hr tablet TAKE 1 TABLET (25 MG TOTAL) BY MOUTH DAILY. 90 tablet 1   sertraline (ZOLOFT) 100 MG tablet TAKE 1 TABLET BY MOUTH EVERY DAY 90 tablet 1   triamcinolone cream (KENALOG) 0.5 % Apply 1 Application topically 2 (two) times daily. 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   GERD (gastroesophageal reflux disease)    diet controlled - no meds   Murmur    as a child - no problems as an adult   Palpitations    tx w/metoprolol   Urine incontinence    UTI (lower urinary tract infection)     Past Surgical History:  Procedure Laterality Date   bladder stretch     x 2   CESAREAN SECTION  1981   x 1   COLONOSCOPY     polyps/[place]   TUBAL LIGATION  2006    Social History   Socioeconomic History   Marital status: Married    Spouse name: Trey Paula   Number of children: 1   Years of education: 12   Highest education level: Not on file  Occupational History   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 She has 1 daughter from a previous marriage. They have a dog. 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 She enjoys shopping and spending time with her grandchildren. She has 2 grandchildren ([place] age 65 and Huston Foley age 62). She also has a step grandchild, Alycia Rossetti, age 98.   Social Determinants of Health   Financial Resource Strain: Not on file  Food Insecurity: Not on file  Transportation Needs: Not on file  Physical Activity: Not on file  Stress: Not on file  Social Connections: Not on file  Intimate Partner Violence: Not on file    Family History  Problem Relation Age of Onset   Arthritis Mother    Hyperlipidemia Mother    Hypertension Mother    CAD Mother    Pancreatic cancer Mother    Cancer Maternal Grandmother 67       Breast and unsure uterine/ovary?   Breast cancer Maternal Grandmother    Cancer Paternal Aunt 55       breast   Breast cancer Paternal Aunt    Colon  cancer Neg Hx    Rectal cancer Neg Hx    Stomach cancer Neg Hx    Esophageal cancer Neg Hx     ROS: no fevers or chills, productive cough, hemoptysis, dysphasia, odynophagia, melena, hematochezia, dysuria, hematuria, rash, seizure activity, orthopnea, PND, pedal edema, claudication. Remaining systems are negative.  Physical Exam: Well-developed well-nourished in no acute distress.  Skin is warm and dry.  HEENT is normal.  Neck is supple.  Chest is clear to auscultation with normal expansion.  Cardiovascular exam is regular rate and rhythm.  Abdominal exam nontender or distended. No masses palpated. Extremities show no edema. neuro grossly intact  ECG-normal sinus rhythm at a rate of 66, no ST changes.  Personally reviewed  A/P  1 palpitations-symptoms are well-controlled with present medications.  Continue Toprol.  2  previous episode of chest pain-no recurrent symptoms and follow-up treadmill negative.  Olga Millers, MD

## 2022-09-20 ENCOUNTER — Ambulatory Visit: Payer: BC Managed Care – PPO | Attending: Cardiology | Admitting: Cardiology

## 2022-09-20 ENCOUNTER — Encounter: Payer: Self-pay | Admitting: Cardiology

## 2022-09-20 VITALS — BP 114/60 | HR 64 | Ht 60.0 in | Wt 158.6 lb

## 2022-09-20 DIAGNOSIS — R002 Palpitations: Secondary | ICD-10-CM | POA: Diagnosis not present

## 2022-09-20 MED ORDER — METOPROLOL SUCCINATE ER 25 MG PO TB24: 25.0000 mg | ORAL_TABLET | Freq: Every day | ORAL | 3 refills | Status: DC

## 2022-09-20 NOTE — Patient Instructions (Signed)
Medication Instructions NO CHANGES  *If you need a refill on your cardiac medications before your next appointment, please call your pharmacy*   Follow-Up: At [facility], you and your health needs are our priority.  As part of our continuing mission to provide you with exceptional heart care, we have created designated Provider Care Teams.  These Care Teams include your primary Cardiologist (physician) and Advanced Practice Providers (APPs -  Physician Assistants and Nurse Practitioners) who all work together to provide you with the care you need, when you need it.  We recommend signing up for the patient portal called "MyChart".  Sign up information is provided on this After Visit Summary.  MyChart is used to connect with patients for Virtual Visits (Telemedicine).  Patients are able to view lab/test results, encounter notes, upcoming appointments, etc.  Non-urgent messages can be sent to your provider as well.   To learn more about what you can do with MyChart, go to [email or web address].    Your next appointment:   12 months with Dr. Jens Som

## 2022-10-10 ENCOUNTER — Ambulatory Visit
Admission: RE | Admit: 2022-10-10 | Discharge: 2022-10-10 | Disposition: A | Discharge status: Home / Self Care | Payer: BC Managed Care – PPO | Source: Ambulatory Visit | Attending: Family Medicine

## 2022-10-10 DIAGNOSIS — Z0000 Encounter for general adult medical examination without abnormal findings: Secondary | ICD-10-CM

## 2022-10-20 ENCOUNTER — Ambulatory Visit: Payer: BC Managed Care – PPO | Admitting: Family Medicine

## 2022-10-20 ENCOUNTER — Encounter: Payer: Self-pay | Admitting: Family Medicine

## 2022-10-20 VITALS — BP 110/76 | HR 67 | Resp 18 | Ht 60.0 in | Wt 159.0 lb

## 2022-10-20 DIAGNOSIS — Z6831 Body mass index (BMI) 31.0-31.9, adult: Secondary | ICD-10-CM | POA: Diagnosis not present

## 2022-10-20 DIAGNOSIS — Z1159 Encounter for screening for other viral diseases: Secondary | ICD-10-CM

## 2022-10-20 DIAGNOSIS — F4323 Adjustment disorder with mixed anxiety and depressed mood: Secondary | ICD-10-CM

## 2022-10-20 DIAGNOSIS — F419 Anxiety disorder, unspecified: Secondary | ICD-10-CM

## 2022-10-20 DIAGNOSIS — E669 Obesity, unspecified: Secondary | ICD-10-CM | POA: Diagnosis not present

## 2022-10-20 DIAGNOSIS — F32A Depression, unspecified: Secondary | ICD-10-CM

## 2022-10-20 MED ORDER — SERTRALINE HCL 50 MG PO TABS: 50.0000 mg | ORAL_TABLET | Freq: Every day | ORAL | 3 refills | Status: DC

## 2022-10-20 NOTE — Progress Notes (Signed)
Established Patient Office Visit  Subjective   Patient ID: ANGLA ZEGARELLI, female    DOB: March 23, 1960  Age: 62 y.o. MRN: [id number]  Chief Complaint  Patient presents with   Anxiety   Depression    HPI Mallory Sanchez is a 62 y.o. female presenting today for follow up of mood. Mood: Patient is here to follow up for depression and anxiety, currently managing with Zoloft 50 mg daily. Taking medication without side effects, reports excellent compliance with treatment. Denies mood changes or SI/HI. She feels mood is improved since last visit.  She still has periods of time where she feels down, but she has noticed an improvement as time goes on.     10/20/2022    8:42 AM 07/20/2022    8:14 AM 06/08/2022    8:44 AM  Depression screen PHQ 2/9  Decreased Interest 1 1 2   Down, Depressed, Hopeless 1 1 2   PHQ - 2 Score 2 2 4   Altered sleeping 1 1 2   Tired, decreased energy 0 1 1  Change in appetite 0 0 0  Feeling bad or failure about yourself  0 0 1  Trouble concentrating 0 0 1  Moving slowly or fidgety/restless 0 0 2  Suicidal thoughts 0 0 0  PHQ-9 Score 3 4 11   Difficult doing work/chores Not difficult at all Not difficult at all Not difficult at all       10/20/2022    8:43 AM 07/20/2022    8:14 AM 06/08/2022    8:44 AM 03/10/2022    3:54 PM  GAD 7 : Generalized Anxiety Score  Nervous, Anxious, on Edge 2 1 3 1   Control/stop worrying 2 2 3 1   Worry too much - different things 1 2 2 1   Trouble relaxing 1 2 2 1   Restless 1 1 2  0  Easily annoyed or irritable 0 0 1 0  Afraid - awful might happen 1 1 2 1   Total GAD 7 Score 8 9 15 5   Anxiety Difficulty Not difficult at all Not difficult at all Not difficult at all Not difficult at all    Outpatient Medications Prior to Visit  Medication Sig   metoprolol succinate (TOPROL-XL) 25 MG 24 hr tablet Take 1 tablet (25 mg total) by mouth daily.   triamcinolone cream (KENALOG) 0.5 % Apply 1 Application topically 2 (two) times daily.    [DISCONTINUED] sertraline (ZOLOFT) 100 MG tablet TAKE 1 TABLET BY MOUTH EVERY DAY   Facility-Administered Medications Prior to Visit  Medication Dose Route Frequency Provider   0.9 %  sodium chloride infusion  500 mL Intravenous Once Lynann Bologna, MD    ROS Negative unless otherwise noted in HPI   Objective:     BP 110/76 (BP Location: Left Arm, Patient Position: Sitting, Cuff Size: Normal)   Pulse 67   Resp 18   Ht 5' (1.524 m)   Wt 159 lb (72.1 kg)   SpO2 96%   BMI 31.05 kg/m   Physical Exam Constitutional:      General: She is not in acute distress.    Appearance: Normal appearance.  HENT:     Head: Normocephalic and atraumatic.  Pulmonary:     Effort: Pulmonary effort is normal. No respiratory distress.  Musculoskeletal:     Cervical back: Normal range of motion.  Neurological:     General: No focal deficit present.     Mental Status: She is alert and oriented to person, place, and time.  Mental status is at baseline.  Psychiatric:        Mood and Affect: Mood normal.        Thought Content: Thought content normal.        Judgment: Judgment normal.     Assessment & Plan:  Adjustment disorder with mixed anxiety and depressed mood Assessment & Plan: PHQ-9 score slightly improved to 3, GAD-7 slightly improved to 8.  Continue Zoloft 50 mg daily.  Will continue to monitor.  Orders: -     Sertraline HCl; Take 1 tablet (50 mg total) by mouth daily.  Dispense: 90 tablet; Refill: 3  We discussed the shingles vaccine recommendations, patient is not interested in receiving it at this time.  Return in about 5 months (around 03/22/2023) for annual physical, fasting blood work 1 week before.    Melida Quitter, PA

## 2022-10-20 NOTE — Assessment & Plan Note (Signed)
PHQ-9 score slightly improved to 3, GAD-7 slightly improved to 8.  Continue Zoloft 50 mg daily.  Will continue to monitor.

## 2022-10-20 NOTE — Patient Instructions (Signed)
If you do decide that you would like to get the shingles vaccine, you can get it here or at your pharmacy.  If you do have any questions about it also please let me know!

## 2023-03-02 ENCOUNTER — Encounter: Payer: Self-pay | Admitting: Family Medicine

## 2023-03-04 ENCOUNTER — Encounter: Payer: Self-pay | Admitting: Family Medicine

## 2023-03-14 ENCOUNTER — Other Ambulatory Visit: Payer: BC Managed Care – PPO

## 2023-03-14 DIAGNOSIS — Z1159 Encounter for screening for other viral diseases: Secondary | ICD-10-CM

## 2023-03-14 DIAGNOSIS — Z6831 Body mass index (BMI) 31.0-31.9, adult: Secondary | ICD-10-CM

## 2023-03-15 ENCOUNTER — Other Ambulatory Visit: Payer: BC Managed Care – PPO

## 2023-03-15 LAB — CBC WITH DIFFERENTIAL/PLATELET
Basophils Absolute: 0.1 10*3/uL (ref 0.0–0.2)
Basos: 1 %
EOS (ABSOLUTE): 0.1 10*3/uL (ref 0.0–0.4)
Eos: 1 %
Hematocrit: 40.7 % (ref 34.0–46.6)
Hemoglobin: 13.5 g/dL (ref 11.1–15.9)
Immature Grans (Abs): 0 10*3/uL (ref 0.0–0.1)
Immature Granulocytes: 0 %
Lymphocytes Absolute: 1.7 10*3/uL (ref 0.7–3.1)
Lymphs: 22 %
MCH: 32 pg (ref 26.6–33.0)
MCHC: 33.2 g/dL (ref 31.5–35.7)
MCV: 96 fL (ref 79–97)
Monocytes Absolute: 0.5 10*3/uL (ref 0.1–0.9)
Monocytes: 6 %
Neutrophils Absolute: 5.2 10*3/uL (ref 1.4–7.0)
Neutrophils: 70 %
Platelets: 275 10*3/uL (ref 150–450)
RBC: 4.22 x10E6/uL (ref 3.77–5.28)
RDW: 12.2 % (ref 11.7–15.4)
WBC: 7.4 10*3/uL (ref 3.4–10.8)

## 2023-03-15 LAB — LIPID PANEL
Chol/HDL Ratio: 3.5 {ratio} (ref 0.0–4.4)
Cholesterol, Total: 205 mg/dL — ABNORMAL HIGH (ref 100–199)
HDL: 58 mg/dL (ref >39)
LDL Chol Calc (NIH): 125 mg/dL — ABNORMAL HIGH (ref 0–99)
Triglycerides: 122 mg/dL (ref 0–149)
VLDL Cholesterol Cal: 22 mg/dL (ref 5–40)

## 2023-03-15 LAB — COMPREHENSIVE METABOLIC PANEL
ALT: 18 [IU]/L (ref 0–32)
AST: 19 [IU]/L (ref 0–40)
Albumin: 4.6 g/dL (ref 3.9–4.9)
Alkaline Phosphatase: 76 [IU]/L (ref 44–121)
BUN/Creatinine Ratio: 13 (ref 12–28)
BUN: 9 mg/dL (ref 8–27)
Bilirubin Total: 0.6 mg/dL (ref 0.0–1.2)
CO2: 24 mmol/L (ref 20–29)
Calcium: 9.8 mg/dL (ref 8.7–10.3)
Chloride: 102 mmol/L (ref 96–106)
Creatinine, Ser: 0.69 mg/dL (ref 0.57–1.00)
Globulin, Total: 2.4 g/dL (ref 1.5–4.5)
Glucose: 81 mg/dL (ref 70–99)
Potassium: 4.4 mmol/L (ref 3.5–5.2)
Sodium: 142 mmol/L (ref 134–144)
Total Protein: 7 g/dL (ref 6.0–8.5)
eGFR: 99 mL/min/{1.73_m2} (ref >59)

## 2023-03-15 LAB — VITAMIN D 25 HYDROXY (VIT D DEFICIENCY, FRACTURES): Vit D, 25-Hydroxy: 23.7 ng/mL — ABNORMAL LOW (ref 30.0–100.0)

## 2023-03-15 LAB — TSH RFX ON ABNORMAL TO FREE T4: TSH: 2.37 u[IU]/mL (ref 0.450–4.500)

## 2023-03-15 LAB — HEPATITIS C ANTIBODY: Hep C Virus Ab: NONREACTIVE

## 2023-03-15 LAB — HEMOGLOBIN A1C
Est. average glucose Bld gHb Est-mCnc: 103 mg/dL
Hgb A1c MFr Bld: 5.2 % (ref 4.8–5.6)

## 2023-03-15 LAB — HIV ANTIBODY (ROUTINE TESTING W REFLEX): HIV Screen 4th Generation wRfx: NONREACTIVE

## 2023-03-21 ENCOUNTER — Encounter: Payer: Self-pay | Admitting: Gastroenterology

## 2023-03-22 ENCOUNTER — Encounter: Payer: Self-pay | Admitting: Family Medicine

## 2023-03-22 ENCOUNTER — Ambulatory Visit (INDEPENDENT_AMBULATORY_CARE_PROVIDER_SITE_OTHER): Payer: BC Managed Care – PPO | Admitting: Family Medicine

## 2023-03-22 VITALS — BP 108/68 | HR 60 | Ht 60.0 in | Wt 158.5 lb

## 2023-03-22 DIAGNOSIS — E559 Vitamin D deficiency, unspecified: Secondary | ICD-10-CM | POA: Insufficient documentation

## 2023-03-22 DIAGNOSIS — F32A Depression, unspecified: Secondary | ICD-10-CM

## 2023-03-22 DIAGNOSIS — F419 Anxiety disorder, unspecified: Secondary | ICD-10-CM | POA: Diagnosis not present

## 2023-03-22 DIAGNOSIS — Z0000 Encounter for general adult medical examination without abnormal findings: Secondary | ICD-10-CM

## 2023-03-22 MED ORDER — VITAMIN D (ERGOCALCIFEROL) 1.25 MG (50000 UNIT) PO CAPS: 50000.0000 [IU] | ORAL_CAPSULE | ORAL | 0 refills | Status: DC

## 2023-03-22 NOTE — Assessment & Plan Note (Signed)
 Start 3 months of prescription strength vitamin D, then switch to OTC vitamin D3 2000 unit daily supplement.

## 2023-03-22 NOTE — Assessment & Plan Note (Signed)
 Patient reports fairly stable, the 2-year anniversary of her husband's death was February 01, 2025which was a difficult time.  Continue Zoloft 50 mg daily, will continue to monitor.

## 2023-03-22 NOTE — Patient Instructions (Addendum)
 It has been so wonderful working with you, I wish you all the best and hope that the job transition goes better than expected, or you find something that is even better for you!  VITAMIN D: Your vitamin D is low, so we will start a weekly prescription strength vitamin D supplement for 3 months to increase vitamin D levels to the normal range.  After that, you can switch to an over-the-counter vitamin D3 2000 unit daily supplement to maintain vitamin D.   SHINGLES: If you have any questions or concerns about the shingles vaccine, please let us know.  Otherwise, let us know if you would like to get the 2 dose series at our office or at your pharmacy!

## 2023-03-22 NOTE — Progress Notes (Signed)
 Complete physical exam  Patient: Mallory Sanchez   DOB: 1961/01/21   63 y.o. Female  MRN: [id number]  Subjective:    Chief Complaint  Patient presents with   Annual Exam    Mallory Sanchez is a 63 y.o. female who presents today for a complete physical exam. She reports consuming a general diet.  She generally feels well.  She believes the sertraline is currently doing well, though she feels that she may not need the medication if she had a less stressful job.  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  She has considered looking for a different job, after working at the same place for 38 years she is hesitant to start over completely.   Most recent fall risk assessment:    03/22/2023    9:34 AM  Fall Risk   Falls in the past year? 0  Number falls in past yr: 0  Injury with Fall? 0  Risk for fall due to : No Fall Risks  Follow up Falls evaluation completed     Most recent depression and anxiety screenings:    03/22/2023    9:35 AM 10/20/2022    8:42 AM  PHQ 2/9 Scores  PHQ - 2 Score 4 2  PHQ- 9 Score 8 3      03/22/2023    9:35 AM 10/20/2022    8:43 AM 07/20/2022    8:14 AM 06/08/2022    8:44 AM  GAD 7 : Generalized Anxiety Score  Nervous, Anxious, on Edge 3 2 1 3   Control/stop worrying 3 2 2 3   Worry too much - different things 2 1 2 2   Trouble relaxing 2 1 2 2   Restless 1 1 1 2   Easily annoyed or irritable 1 0 0 1  Afraid - awful might happen 1 1 1 2   Total GAD 7 Score 13 8 9 15   Anxiety Difficulty Not difficult at all Not difficult at all Not difficult at all Not difficult at all    Patient Active Problem List   Diagnosis Date Noted   Vitamin D deficiency 03/22/2023   Genital pruritus 06/08/2022   Anxiety and depression 12/08/2021   Fatigue 09/13/2021   Chronic back pain 09/13/2021   Palpitations 03/23/2015   Elevated ferritin level 07/11/2013   Arrhythmia 10/05/2012    Past  Surgical History:  Procedure Laterality Date   bladder stretch     x 2   CESAREAN SECTION  1981   x 1   COLONOSCOPY     polyps/[facility]   TUBAL LIGATION  2006   Social History   Tobacco Use   Smoking status: Never    Passive exposure: Never   Smokeless tobacco: Never  Vaping Use   Vaping status: Never Used  Substance Use Topics   Alcohol use: No   Drug use: No   Family History  Problem Relation Age of Onset   Arthritis Mother    Hyperlipidemia Mother    Hypertension Mother    CAD Mother    Pancreatic cancer Mother    Cancer Maternal Grandmother 94       Breast and unsure uterine/ovary?   Breast cancer Maternal Grandmother    Cancer Paternal Aunt 6       breast   Breast cancer Paternal Aunt    Colon cancer Neg Hx    Rectal cancer Neg Hx    Stomach  cancer Neg Hx    Esophageal cancer Neg Hx    Allergies  Allergen Reactions   Compazine [Prochlorperazine Edisylate] Other (See Comments)    Cognitive change   Levaquin [Levofloxacin] Other (See Comments)    Caused myalgia.   Bactrim [Sulfamethoxazole-Trimethoprim] Nausea Only   Clarithromycin Diarrhea   Erythromycin Nausea Only   Hydrocodone-Acetaminophen Nausea And Vomiting   Prochlorperazine Other (See Comments)    "stare off and my mouth drops open"     Patient Care Team: Melida Quitter, PA as PCP - General (Family Medicine)   Outpatient Medications Prior to Visit  Medication Sig   metoprolol succinate (TOPROL-XL) 25 MG 24 hr tablet Take 1 tablet (25 mg total) by mouth daily.   sertraline (ZOLOFT) 50 MG tablet Take 1 tablet (50 mg total) by mouth daily.   triamcinolone cream (KENALOG) 0.5 % Apply 1 Application topically 2 (two) times daily.   Facility-Administered Medications Prior to Visit  Medication Dose Route Frequency Provider   0.9 %  sodium chloride infusion  500 mL Intravenous Once Lynann Bologna, MD    Review of Systems  Constitutional:  Negative for chills, fever and malaise/fatigue.  HENT:   Negative for congestion and hearing loss.   Eyes:  Negative for blurred vision and double vision.  Respiratory:  Negative for cough and shortness of breath.   Cardiovascular:  Negative for chest pain, palpitations and leg swelling.  Gastrointestinal:  Negative for abdominal pain, constipation, diarrhea and heartburn.  Genitourinary:  Negative for frequency and urgency.  Musculoskeletal:  Negative for myalgias and neck pain.  Neurological:  Negative for headaches.  Endo/Heme/Allergies:  Negative for polydipsia.  Psychiatric/Behavioral:  Negative for depression. The patient is nervous/anxious.       Objective:    BP 108/68   Pulse 60   Ht 5' (1.524 m)   Wt 158 lb 8 oz (71.9 kg)   SpO2 98%   BMI 30.95 kg/m    Physical Exam Constitutional:      General: She is not in acute distress.    Appearance: Normal appearance.  HENT:     Head: Normocephalic and atraumatic.     Right Ear: Tympanic membrane, ear canal and external ear normal.     Left Ear: Tympanic membrane, ear canal and external ear normal.     Nose: Nose normal.     Mouth/Throat:     Mouth: Mucous membranes are moist.     Pharynx: No oropharyngeal exudate or posterior oropharyngeal erythema.  Eyes:     Extraocular Movements: Extraocular movements intact.     Conjunctiva/sclera: Conjunctivae normal.     Pupils: Pupils are equal, round, and reactive to light.     Comments: Wearing glasses  Neck:     Thyroid: No thyroid mass, thyromegaly or thyroid tenderness.  Cardiovascular:     Rate and Rhythm: Normal rate and regular rhythm.     Heart sounds: Normal heart sounds. No murmur heard.    No friction rub. No gallop.  Pulmonary:     Effort: Pulmonary effort is normal. No respiratory distress.     Breath sounds: Normal breath sounds. No wheezing, rhonchi or rales.  Abdominal:     General: Abdomen is flat. Bowel sounds are normal. There is no distension.     Palpations: There is no mass.     Tenderness: There is no  abdominal tenderness. There is no guarding.  Musculoskeletal:        General: Normal range of motion.  Cervical back: Normal range of motion and neck supple.  Lymphadenopathy:     Cervical: No cervical adenopathy.  Skin:    General: Skin is warm and dry.  Neurological:     Mental Status: She is alert and oriented to person, place, and time.     Cranial Nerves: No cranial nerve deficit.     Motor: No weakness.     Deep Tendon Reflexes: Reflexes normal.  Psychiatric:        Mood and Affect: Mood normal.        Assessment & Plan:    Routine Health Maintenance and Physical Exam  Immunization History  Administered Date(s) Administered   Influenza-Unspecified 11/08/2012, 10/24/2016   Tdap 04/18/2014   Unspecified SARS-COV-2 Vaccination 06/28/2019, 07/19/2019    Health Maintenance  Topic Date Due   Zoster Vaccines- Shingrix (1 of 2) Never done   COVID-19 Vaccine (3 - 2024-25 season) 09/25/2022   INFLUENZA VACCINE  04/24/2023 (Originally 08/25/2022)   DTaP/Tdap/Td (2 - Td or Tdap) 04/17/2024   MAMMOGRAM  10/09/2024   Cervical Cancer Screening (HPV/Pap Cotest)  03/10/2025   Colonoscopy  01/16/2028   Hepatitis C Screening  Completed   HIV Screening  Completed   HPV VACCINES  Aged Out    Reviewed most recent labs including CBC, CMP, lipid panel, A1C, TSH, and vitamin D. All within normal limits/stable from last check other than Vitamin D low 23.7, LDL stable at 125. Revisited the conversation about shingles vaccine recommendations.  She states that she is nervous about getting the vaccine but does not have any specific questions or concerns.  She is still considering/waiting to get the series.  Discussed health benefits of physical activity, and encouraged her to engage in regular exercise appropriate for her age and condition.  Wellness examination  Vitamin D deficiency Assessment & Plan: Start 3 months of prescription strength vitamin D, then switch to OTC vitamin D3 2000  unit daily supplement.  Orders: -     Vitamin D (Ergocalciferol); Take 1 capsule (50,000 Units total) by mouth every 7 (seven) days.  Dispense: 12 capsule; Refill: 0  Anxiety and depression Assessment & Plan: Patient reports fairly stable, the 2-year anniversary of her husband's death was 02-20-25which was a difficult time.  Continue Zoloft 50 mg daily, will continue to monitor.     Return in about 6 months (around 09/19/2023) for follow-up for mood.     Melida Quitter, PA

## 2023-09-12 ENCOUNTER — Ambulatory Visit

## 2023-09-12 VITALS — BP 101/67 | HR 68 | Temp 98.3°F | Ht 60.0 in | Wt 161.1 lb

## 2023-09-12 DIAGNOSIS — F32A Depression, unspecified: Secondary | ICD-10-CM

## 2023-09-12 DIAGNOSIS — G4709 Other insomnia: Secondary | ICD-10-CM

## 2023-09-12 DIAGNOSIS — F419 Anxiety disorder, unspecified: Secondary | ICD-10-CM

## 2023-09-12 DIAGNOSIS — G4700 Insomnia, unspecified: Secondary | ICD-10-CM | POA: Insufficient documentation

## 2023-09-12 MED ORDER — HYDROXYZINE PAMOATE 25 MG PO CAPS: 25.0000 mg | ORAL_CAPSULE | Freq: Three times a day (TID) | ORAL | 1 refills | Status: AC | PRN

## 2023-09-12 MED ORDER — SERTRALINE HCL 50 MG PO TABS
ORAL_TABLET | ORAL | 3 refills | Status: DC
Start: 2023-09-12 — End: 2023-10-19

## 2023-09-12 NOTE — Patient Instructions (Signed)
 VISIT SUMMARY: During your visit, we discussed your ongoing anxiety and emotional challenges, particularly in light of recent life changes and the loss of your husband. We also reviewed your sleep disturbances and current medication regimen.  YOUR PLAN: -ANXIETY AND DEPRESSION: Anxiety and depression can be worsened by significant life changes and grief. We will increase your Zoloft  dose to 50 mg daily for one week, and if you tolerate it well, we may increase it to 75 mg daily. Additionally, we have prescribed hydroxyzine  25 mg to be taken as needed for acute anxiety and sleep. We discussed the benefits of Zoloft  and assured you that it is non-habit forming. We also talked about the possibility of therapy, but you are not ready for that step yet.  -INSOMNIA: Insomnia, or difficulty sleeping, can be related to anxiety and grief. We have prescribed hydroxyzine  25 mg to be taken as needed for sleep. Start by taking it at night to see how well you tolerate it.  INSTRUCTIONS: Please follow up in one week to review how you are tolerating the increased Zoloft  dose and to discuss any changes in your symptoms. If you experience any side effects or have concerns before then, please contact our office.  If you have any problems before your next visit feel free to message me via MyChart (minor issues or questions) or call the office, otherwise you may reach out to schedule an office visit.  Thank you! Saddie Sacks, PA-C

## 2023-09-12 NOTE — Assessment & Plan Note (Signed)
 PHQ-9: 4, GAD-7: 8. Anxiety and depression worsened by life changes and grief. Zoloft  reduction led to increased symptoms. Considering dosage increase. - Increase Zoloft  to 50 mg daily for one week, then consider 75 mg daily if tolerated. - Prescribe hydroxyzine  25 mg as needed for acute anxiety and sleep. - Discussed therapy referral; patient verbalized that she is not ready for this yet.  - Explained Zoloft 's non-habit forming nature and dosage benefits. - Discussed temporary medication use for current life phase.

## 2023-09-12 NOTE — Assessment & Plan Note (Signed)
 Intermittent insomnia likely related to anxiety and grief. - Prescribe hydroxyzine  25 mg as needed for sleep, starting with nighttime use to assess tolerance.

## 2023-09-12 NOTE — Progress Notes (Signed)
 Established Patient Office Visit  Subjective   Patient ID: Mallory  [name], female    DOB: 09-Sep-1960  Age: 63 y.o. MRN: [id number]  Chief Complaint  Patient presents with   Medical Management of Chronic Issues    HPI  Discussed the use of AI scribe software for clinical note transcription with the patient, who gave verbal consent to proceed.  History of Present Illness   Mallory Sanchez is a 63 year old female with anxiety who presents for follow-up on Zoloft  medication and anxiety management.  Anxiety and emotional lability - Increased anxiety and frequent crying episodes attributed to recent life changes, including working from home and the loss of her husband - Prefers being around people and staying busy; difficulty adjusting to working from home since April due to job relocation - Attempts to reduce Zoloft  dose led to worsening anxiety and crying - Supportive family network, including a nearby daughter and grandchildren, but not yet ready to participate in family outings such as a recent beach trip - Actively adjusting to significant life changes despite strong support system  Sleep disturbance - Variable sleep pattern with intermittent insomnia, present even prior to her husband's death - Difficulty falling asleep and frequent awakening due to persistent thoughts - Identifies as a morning person and occasionally naps during the day  Sertraline  (zoloft ) use and medication adjustment - Currently taking sertraline  (Zoloft ) 50 mg daily for anxiety - Initial dose of 100 mg was intolerable; reduced to 50 mg, then to 25 mg, and attempted every other day dosing, which resulted in increased anxiety and crying - Recently resumed 50 mg daily dosing          ROS Per HPI.    Objective:     BP 101/67   Pulse 68   Temp 98.3 F (36.8 C) (Oral)   Ht 5' (1.524 m)   Wt 161 lb 1.9 oz (73.1 kg)   SpO2 98%   BMI 31.47 kg/m    Physical Exam Constitutional:      General:  She is not in acute distress.    Appearance: Normal appearance.  Cardiovascular:     Rate and Rhythm: Normal rate and regular rhythm.     Heart sounds: Normal heart sounds. No murmur heard.    No friction rub. No gallop.  Pulmonary:     Effort: Pulmonary effort is normal. No respiratory distress.     Breath sounds: Normal breath sounds.  Musculoskeletal:        General: No swelling.  Skin:    General: Skin is warm and dry.  Neurological:     General: No focal deficit present.     Mental Status: She is alert.  Psychiatric:        Mood and Affect: Mood normal.        Behavior: Behavior normal.        Thought Content: Thought content normal.    No results found for any visits on 09/12/23.    The 10-year ASCVD risk score (Arnett DK, et al., 2019) is: 2.3%    Assessment & Plan:   Other insomnia Assessment & Plan: Intermittent insomnia likely related to anxiety and grief. - Prescribe hydroxyzine  25 mg as needed for sleep, starting with nighttime use to assess tolerance.      Anxiety and depression Assessment & Plan: PHQ-9: 4, GAD-7: 8. Anxiety and depression worsened by life changes and grief. Zoloft  reduction led to increased symptoms. Considering dosage increase. - Increase Zoloft  to  50 mg daily for one week, then consider 75 mg daily if tolerated. - Prescribe hydroxyzine  25 mg as needed for acute anxiety and sleep. - Discussed therapy referral; patient verbalized that she is not ready for this yet.  - Explained Zoloft 's non-habit forming nature and dosage benefits. - Discussed temporary medication use for current life phase.  Orders: -     Sertraline  HCl; Take 1 tablet (50 mg total) by mouth daily for 7 days, THEN 1.5 tablets (75 mg total) daily.  Dispense: 90 tablet; Refill: 3  Other orders -     hydrOXYzine  Pamoate; Take 1 capsule (25 mg total) by mouth every 8 (eight) hours as needed for anxiety.  Dispense: 30 capsule; Refill: 1           Return in about 3  months (around 12/13/2023) for Mood.    Mallory JULIANNA Sacks, PA-C

## 2023-09-19 ENCOUNTER — Ambulatory Visit

## 2023-09-20 ENCOUNTER — Other Ambulatory Visit: Payer: Self-pay

## 2023-09-20 DIAGNOSIS — Z1231 Encounter for screening mammogram for malignant neoplasm of breast: Secondary | ICD-10-CM

## 2023-10-11 ENCOUNTER — Ambulatory Visit
Admission: RE | Admit: 2023-10-11 | Discharge: 2023-10-11 | Disposition: A | Discharge status: Home / Self Care | Source: Ambulatory Visit

## 2023-10-11 DIAGNOSIS — Z1231 Encounter for screening mammogram for malignant neoplasm of breast: Secondary | ICD-10-CM

## 2023-10-19 ENCOUNTER — Other Ambulatory Visit: Payer: Self-pay

## 2023-10-19 DIAGNOSIS — F32A Depression, unspecified: Secondary | ICD-10-CM

## 2023-10-24 ENCOUNTER — Other Ambulatory Visit: Payer: Self-pay | Admitting: Cardiology

## 2023-10-24 DIAGNOSIS — R002 Palpitations: Secondary | ICD-10-CM

## 2023-11-19 ENCOUNTER — Other Ambulatory Visit: Payer: Self-pay | Admitting: Cardiology

## 2023-11-19 DIAGNOSIS — R002 Palpitations: Secondary | ICD-10-CM

## 2023-11-23 ENCOUNTER — Telehealth: Payer: Self-pay | Admitting: *Deleted

## 2023-11-23 NOTE — Telephone Encounter (Signed)
 LVM for pt to call office to reschedule appt that is scheduled on 11/19 with Kara.  Saddie will be out of the office that day so we need to reschedule. Please assist her in getting this rescheduled. Will also send a mychart message informing her of this.

## 2023-12-13 ENCOUNTER — Ambulatory Visit

## 2024-01-01 ENCOUNTER — Ambulatory Visit

## 2024-01-01 VITALS — BP 116/73 | HR 61 | Temp 98.2°F | Ht 60.0 in | Wt 168.1 lb

## 2024-01-01 DIAGNOSIS — Z6831 Body mass index (BMI) 31.0-31.9, adult: Secondary | ICD-10-CM

## 2024-01-01 DIAGNOSIS — F419 Anxiety disorder, unspecified: Secondary | ICD-10-CM

## 2024-01-01 DIAGNOSIS — F32A Depression, unspecified: Secondary | ICD-10-CM

## 2024-01-01 DIAGNOSIS — R5383 Other fatigue: Secondary | ICD-10-CM

## 2024-01-01 DIAGNOSIS — L293 Anogenital pruritus, unspecified: Secondary | ICD-10-CM

## 2024-01-01 DIAGNOSIS — E66811 Obesity, class 1: Secondary | ICD-10-CM

## 2024-01-01 MED ORDER — TRIAMCINOLONE ACETONIDE 0.5 % EX CREA: 1.0000 | TOPICAL_CREAM | Freq: Two times a day (BID) | CUTANEOUS | 3 refills | Status: AC

## 2024-01-01 MED ORDER — SERTRALINE HCL 50 MG PO TABS: 50.0000 mg | ORAL_TABLET | Freq: Every day | ORAL | 1 refills | Status: AC

## 2024-01-01 NOTE — Patient Instructions (Signed)
 VISIT SUMMARY: Today we discussed your anxiety, sleep disturbance, weight gain, physical inactivity, fatigue, and hypertension management. We also reviewed your general health maintenance.  YOUR PLAN: ANXIETY AND DEPRESSION: Your anxiety is well-managed with sertraline , and hydroxyzine  is effective for sleep. -Continue taking sertraline  50 mg daily. -Your sertraline  prescription has been refilled. -Your hydroxyzine  prescription has been refilled.   GENERAL HEALTH MAINTENANCE: You are up to date on your mammogram and Pap smear. You declined shingles and pneumonia vaccines. Your tetanus vaccine is due next year. -Plan to have comprehensive lab work done before your next physical, including tests for B12, vitamin D , A1c, cholesterol, kidney, and liver function.  If you have any problems before your next visit feel free to message me via MyChart (minor issues or questions) or call the office, otherwise you may reach out to schedule an office visit.  Thank you! Saddie Sacks, PA-C

## 2024-01-02 NOTE — Progress Notes (Signed)
 Established Patient Office Visit  Subjective   Patient ID: Mallory Sanchez, female    DOB: [birth year]  Age: 63 y.o. MRN: [id number]  Chief Complaint  Patient presents with   Medication Management    HPI   History of Present Illness   Mallory Sanchez  Mallory Sanchez is a 63 year old female with anxiety and hypertension who presents for medication management and follow-up.  Anxiety and sleep disturbance - Stable on sertraline  50 mg daily for anxiety with no desire for dose adjustment - Hydroxyzine  prescribed for sleep; effective for sleep initiation but causes daytime somnolence if she uses it during the day  - Reports that mood is much improved from the last time she was in the office  - Happy at the current dosages of her medications   Weight gain and physical inactivity - Weight gain of approximately ten pounds since transitioning to working from home - Attributes weight gain to decreased physical activity          ROS Per HPI.    Objective:     BP 116/73   Pulse 61   Temp 98.2 F (36.8 C) (Oral)   Ht 5' (1.524 m)   Wt 168 lb 1.9 oz (76.3 kg)   SpO2 99%   BMI 32.83 kg/m    Physical Exam Constitutional:      General: She is not in acute distress.    Appearance: Normal appearance.  Cardiovascular:     Rate and Rhythm: Normal rate and regular rhythm.     Heart sounds: Normal heart sounds. No murmur heard.    No friction rub. No gallop.  Pulmonary:     Effort: Pulmonary effort is normal. No respiratory distress.     Breath sounds: Normal breath sounds.  Musculoskeletal:        General: No swelling.  Skin:    General: Skin is warm and dry.  Neurological:     General: No focal deficit present.     Mental Status: She is alert.  Psychiatric:        Mood and Affect: Mood normal.        Behavior: Behavior normal.        Thought Content: Thought content normal.      No results found for any visits on 01/01/24.    The 10-year ASCVD risk score (Arnett DK, et  al., 2019) is: 3.3%    Assessment & Plan:   Class 1 obesity without serious comorbidity with body mass index (BMI) of 31.0 to 31.9 in adult, unspecified obesity type -     B12 and Folate Panel; Future -     VITAMIN D  25 Hydroxy (Vit-D Deficiency, Fractures); Future -     CBC with Differential/Platelet; Future -     Comprehensive metabolic panel with GFR; Future -     Lipid panel; Future -     Hemoglobin A1c; Future  Genital pruritus -     Triamcinolone  Acetonide; Apply 1 Application topically 2 (two) times daily.  Dispense: 30 g; Refill: 3  Anxiety and depression Assessment & Plan: Well-managed with sertraline  50 mg daily. Hydroxyzine  effective for sleep. Discussed hydroxyzine 's sedative effects and 10 mg option for less sedation.  - Continue sertraline  50 mg daily. - Refilled sertraline  prescription. - Refilled hydroxyzine  prescription for 25 mg for prn sleep. - Consider 10 mg hydroxyzine  tablet for acute anxiety if needed.  Orders: -     Sertraline  HCl; Take 1 tablet (50 mg total) by mouth daily.  Dispense: 90 tablet; Refill: 1  Other fatigue -     B12 and Folate Panel; Future -     VITAMIN D  25 Hydroxy (Vit-D Deficiency, Fractures); Future    Return in about 3 months (around 03/31/2024) for Physical.    Mallory JULIANNA Sacks, PA-C

## 2024-01-02 NOTE — Assessment & Plan Note (Signed)
 Well-managed with sertraline  50 mg daily. Hydroxyzine  effective for sleep. Discussed hydroxyzine 's sedative effects and 10 mg option for less sedation.  - Continue sertraline  50 mg daily. - Refilled sertraline  prescription. - Refilled hydroxyzine  prescription for 25 mg for prn sleep. - Consider 10 mg hydroxyzine  tablet for acute anxiety if needed.

## 2024-01-29 NOTE — Progress Notes (Signed)
 "    HPI: FU palpitations. Monitor March 2017 showed sinus with occasional PACs, brief PAT, rare PVC and isolated couplet. Echocardiogram March 2017 showed vigorous LV systolic function. Exercise treadmill November 2017 negative. Since last seen the patient denies any dyspnea on exertion, orthopnea, PND, pedal edema, palpitations, syncope or chest pain.   Current Outpatient Medications  Medication Sig Dispense Refill   Azelastine HCl 137 MCG/SPRAY SOLN Place 2 sprays into both nostrils 2 (two) times daily.     fluconazole  (DIFLUCAN ) 150 MG tablet Take 150 mg by mouth as needed.     metoprolol  succinate (TOPROL -XL) 25 MG 24 hr tablet TAKE 1 TABLET (25 MG TOTAL) BY MOUTH DAILY. 90 tablet 0   sertraline  (ZOLOFT ) 50 MG tablet Take 1 tablet (50 mg total) by mouth daily. 90 tablet 1   triamcinolone  cream (KENALOG ) 0.5 % Apply 1 Application topically 2 (two) times daily. 30 g 3   hydrOXYzine  (VISTARIL ) 25 MG capsule Take 1 capsule (25 mg total) by mouth every 8 (eight) hours as needed for anxiety. (Patient not taking: Reported on 02/07/2024) 30 capsule 1   No current facility-administered medications for this visit.     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   GERD (gastroesophageal reflux disease)    diet controlled - no meds   Murmur    as a child - no problems as an adult   Palpitations    tx w/metoprolol    Urine incontinence    UTI (lower urinary tract infection)     Past Surgical History:  Procedure Laterality Date   bladder stretch     x 2   CESAREAN SECTION  1981   x 1   COLONOSCOPY     polyps/Gupta   TUBAL LIGATION  2006    Social History   Socioeconomic History   Marital status: Married    Spouse name: Chyrl   Number of children: 1   Years of education: 12   Highest education level: Not on file  Occupational History   Occupation: Clinical Biochemist    Comment: Research Scientist (physical Sciences)  Tobacco  Use   Smoking status: Never    Passive exposure: Never   Smokeless tobacco: Never  Vaping Use   Vaping status: Never Used  Substance and Sexual Activity   Alcohol use: No   Drug use: No   Sexual activity: Yes    Birth control/protection: Post-menopausal  Other Topics Concern   Not on file  Social History Narrative   Mallory Sanchez  is currently living in [place] with her husband, Chyrl, of 15 years. She has 1 daughter from a previous marriage. They have a dog. 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 She enjoys shopping and spending time with her grandchildren. She has 2 grandchildren (Jordan age 63 and Nola age 37). She also has a step grandchild, Bernardino, age 37.   Social Drivers of Health   Tobacco Use: Low Risk (02/07/2024)   Patient History    Smoking Tobacco Use: Never    Smokeless Tobacco Use: Never    Passive Exposure: Never  Financial Resource Strain: Not on file  Food Insecurity: No Food Insecurity (09/12/2023)   Epic    Worried About Programme Researcher, Broadcasting/film/video in the Last Year: Never true    Ran Out of Food in the Last Year: Never true  Transportation Needs: No Transportation Needs (09/12/2023)  Epic    Lack of Transportation (Medical): No    Lack of Transportation (Non-Medical): No  Physical Activity: Not on file  Stress: Not on file  Social Connections: Not on file  Intimate Partner Violence: Not At Risk (09/12/2023)   Epic    Fear of Current or Ex-Partner: No    Emotionally Abused: No    Physically Abused: No    Sexually Abused: No  Depression (PHQ2-9): Medium Risk (01/01/2024)   Depression (PHQ2-9)    PHQ-2 Score: 6  Alcohol Screen: Not on file  Housing: Low Risk (09/12/2023)   Epic    Unable to Pay for Housing in the Last Year: No    Number of Times Moved in the Last Year: 0    Homeless in the Last Year: No  Utilities: Not At Risk (09/12/2023)   Epic    Threatened with loss of utilities: No  Health Literacy: Not on file    Family History  Problem Relation  Age of Onset   Arthritis Mother    Hyperlipidemia Mother    Hypertension Mother    CAD Mother    Pancreatic cancer Mother    Cancer Maternal Grandmother 48       Breast and unsure uterine/ovary?   Breast cancer Maternal Grandmother    Cancer Paternal Aunt 69       breast   Breast cancer Paternal Aunt    Colon cancer Neg Hx    Rectal cancer Neg Hx    Stomach cancer Neg Hx    Esophageal cancer Neg Hx     ROS: no fevers or chills, productive cough, hemoptysis, dysphasia, odynophagia, melena, hematochezia, dysuria, hematuria, rash, seizure activity, orthopnea, PND, pedal edema, claudication. Remaining systems are negative.  Physical Exam: Well-developed well-nourished in no acute distress.  Skin is warm and dry.  HEENT is normal.  Neck is supple.  Chest is clear to auscultation with normal expansion.  Cardiovascular exam is regular rate and rhythm.  Abdominal exam nontender or distended. No masses palpated. Extremities show no edema. neuro grossly intact  EKG Interpretation Date/Time:  Wednesday February 07 2024 07:50:38 EST Ventricular Rate:  64 PR Interval:  146 QRS Duration:  68 QT Interval:  400 QTC Calculation: 412 R Axis:   60  Text Interpretation: Normal sinus rhythm Normal ECG Confirmed by Pietro Rogue ([id number]) on 02/07/2024 7:51:55 AM    A/P  1 palpitations-symptoms are controlled.  Will continue Toprol  at present dose.  2 history of chest pain-no recent symptoms.  Previous treadmill negative.  Rogue Pietro, MD    "

## 2024-02-07 ENCOUNTER — Encounter: Payer: Self-pay | Admitting: Cardiology

## 2024-02-07 ENCOUNTER — Ambulatory Visit: Admitting: Cardiology

## 2024-02-07 VITALS — BP 102/64 | HR 75 | Ht 60.0 in | Wt 170.0 lb

## 2024-02-07 DIAGNOSIS — R002 Palpitations: Secondary | ICD-10-CM | POA: Diagnosis not present

## 2024-02-07 MED ORDER — METOPROLOL SUCCINATE ER 25 MG PO TB24: 25.0000 mg | ORAL_TABLET | Freq: Every day | ORAL | 3 refills | Status: AC

## 2024-02-07 NOTE — Patient Instructions (Signed)
   Follow-Up: At [facility], you and your health needs are our priority.  As part of our continuing mission to provide you with exceptional heart care, our providers are all part of one team.  This team includes your primary Cardiologist (physician) and Advanced Practice Providers or APPs (Physician Assistants and Nurse Practitioners) who all work together to provide you with the care you need, when you need it.  Your next appointment:   12 month(s)  Provider:   Redell Shallow MD

## 2024-03-25 ENCOUNTER — Other Ambulatory Visit

## 2024-04-05 ENCOUNTER — Encounter
